# Patient Record
Sex: Female | Born: 1985 | Race: White | Hispanic: No | Smoking: Former smoker
Health system: Southern US, Community
[De-identification: ages and names within clinical notes are randomized; demographics above are authoritative.]

## PROBLEM LIST (undated history)

## (undated) DIAGNOSIS — K219 Gastro-esophageal reflux disease without esophagitis: Secondary | ICD-10-CM

## (undated) DIAGNOSIS — K8020 Calculus of gallbladder without cholecystitis without obstruction: Secondary | ICD-10-CM

## (undated) DIAGNOSIS — Z9049 Acquired absence of other specified parts of digestive tract: Secondary | ICD-10-CM

## (undated) DIAGNOSIS — M5430 Sciatica, unspecified side: Secondary | ICD-10-CM

## (undated) DIAGNOSIS — R51 Headache: Secondary | ICD-10-CM

## (undated) DIAGNOSIS — D649 Anemia, unspecified: Secondary | ICD-10-CM

## (undated) HISTORY — DX: Acquired absence of other specified parts of digestive tract: Z90.49

---
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
## 2007-08-22 HISTORY — PX: DILATION AND CURETTAGE OF UTERUS: SHX78

## 2009-07-13 ENCOUNTER — Encounter: Admission: RE | Admit: 2009-07-13 | Discharge: 2009-07-13 | Payer: Self-pay | Admitting: *Deleted

## 2009-11-27 ENCOUNTER — Emergency Department (HOSPITAL_COMMUNITY): Admission: EM | Admit: 2009-11-27 | Discharge: 2009-11-27 | Payer: Self-pay | Admitting: Emergency Medicine

## 2011-12-25 ENCOUNTER — Emergency Department (HOSPITAL_COMMUNITY): Payer: No Typology Code available for payment source

## 2011-12-25 ENCOUNTER — Emergency Department (HOSPITAL_COMMUNITY)
Admission: EM | Admit: 2011-12-25 | Discharge: 2011-12-25 | Disposition: A | Discharge status: Home / Self Care | Payer: No Typology Code available for payment source | Attending: Emergency Medicine | Admitting: Emergency Medicine

## 2011-12-25 ENCOUNTER — Encounter (HOSPITAL_COMMUNITY): Payer: Self-pay | Admitting: *Deleted

## 2011-12-25 DIAGNOSIS — F411 Generalized anxiety disorder: Secondary | ICD-10-CM | POA: Insufficient documentation

## 2011-12-25 DIAGNOSIS — R110 Nausea: Secondary | ICD-10-CM | POA: Insufficient documentation

## 2011-12-25 DIAGNOSIS — R51 Headache: Secondary | ICD-10-CM | POA: Insufficient documentation

## 2011-12-25 DIAGNOSIS — M542 Cervicalgia: Secondary | ICD-10-CM | POA: Insufficient documentation

## 2011-12-25 DIAGNOSIS — R0989 Other specified symptoms and signs involving the circulatory and respiratory systems: Secondary | ICD-10-CM | POA: Insufficient documentation

## 2011-12-25 DIAGNOSIS — R209 Unspecified disturbances of skin sensation: Secondary | ICD-10-CM | POA: Insufficient documentation

## 2011-12-25 DIAGNOSIS — R0609 Other forms of dyspnea: Secondary | ICD-10-CM | POA: Insufficient documentation

## 2011-12-25 DIAGNOSIS — H539 Unspecified visual disturbance: Secondary | ICD-10-CM | POA: Insufficient documentation

## 2011-12-25 DIAGNOSIS — S060X0A Concussion without loss of consciousness, initial encounter: Secondary | ICD-10-CM | POA: Insufficient documentation

## 2011-12-25 LAB — CBC
HCT: 39.2 % (ref 36.0–46.0)
MCH: 32.2 pg (ref 26.0–34.0)
MCHC: 35.2 g/dL (ref 30.0–36.0)
MCV: 91.4 fL (ref 78.0–100.0)
RDW: 12.4 % (ref 11.5–15.5)
WBC: 13 10*3/uL — ABNORMAL HIGH (ref 4.0–10.5)

## 2011-12-25 LAB — POCT I-STAT, CHEM 8
Creatinine, Ser: 0.5 mg/dL (ref 0.50–1.10)
Glucose, Bld: 125 mg/dL — ABNORMAL HIGH (ref 70–99)
Hemoglobin: 13.6 g/dL (ref 12.0–15.0)
Sodium: 140 mEq/L (ref 135–145)
TCO2: 21 mmol/L (ref 0–100)

## 2011-12-25 LAB — URINALYSIS, ROUTINE W REFLEX MICROSCOPIC
Bilirubin Urine: NEGATIVE
Nitrite: NEGATIVE
Protein, ur: NEGATIVE mg/dL
pH: 7.5 (ref 5.0–8.0)

## 2011-12-25 LAB — DIFFERENTIAL
Basophils Absolute: 0 10*3/uL (ref 0.0–0.1)
Eosinophils Absolute: 0 10*3/uL (ref 0.0–0.7)
Eosinophils Relative: 0 % (ref 0–5)
Lymphocytes Relative: 10 % — ABNORMAL LOW (ref 12–46)
Monocytes Absolute: 0.6 10*3/uL (ref 0.1–1.0)

## 2011-12-25 LAB — ETHANOL: Alcohol, Ethyl (B): <11 mg/dL (ref 0–11)

## 2011-12-25 LAB — RAPID URINE DRUG SCREEN, HOSP PERFORMED
Amphetamines: NOT DETECTED
Barbiturates: NOT DETECTED
Benzodiazepines: NOT DETECTED

## 2011-12-25 LAB — ACETAMINOPHEN LEVEL: Acetaminophen (Tylenol), Serum: <15 ug/mL (ref 10–30)

## 2011-12-25 MED ORDER — ONDANSETRON 8 MG PO TBDP
8.0000 mg | ORAL_TABLET | Freq: Once | ORAL | Status: AC
  Administered 2011-12-25: 8 mg via ORAL
  Filled 2011-12-25: qty 1

## 2011-12-25 MED ORDER — HYDROCODONE-ACETAMINOPHEN 5-325 MG PO TABS: 1.0000 | ORAL_TABLET | ORAL | Status: AC | PRN

## 2011-12-25 MED ORDER — ALPRAZOLAM 0.5 MG PO TABS
0.5000 mg | ORAL_TABLET | Freq: Once | ORAL | Status: AC
  Administered 2011-12-25: 0.5 mg via ORAL
  Filled 2011-12-25: qty 1

## 2011-12-25 MED ORDER — OXYCODONE-ACETAMINOPHEN 5-325 MG PO TABS
2.0000 | ORAL_TABLET | Freq: Once | ORAL | Status: AC
  Administered 2011-12-25: 2 via ORAL
  Filled 2011-12-25: qty 2

## 2011-12-25 MED ORDER — TETANUS-DIPHTH-ACELL PERTUSSIS 5-2.5-18.5 LF-MCG/0.5 IM SUSP
0.5000 mL | Freq: Once | INTRAMUSCULAR | Status: AC
  Administered 2011-12-25: 0.5 mL via INTRAMUSCULAR
  Filled 2011-12-25: qty 0.5

## 2011-12-25 MED ORDER — DIAZEPAM 5 MG/ML IJ SOLN
5.0000 mg | Freq: Once | INTRAMUSCULAR | Status: DC
  Filled 2011-12-25: qty 2

## 2011-12-25 MED ORDER — LORAZEPAM 1 MG PO TABS
1.0000 mg | ORAL_TABLET | Freq: Once | ORAL | Status: AC
  Administered 2011-12-25: 1 mg via ORAL
  Filled 2011-12-25: qty 1

## 2011-12-25 MED ORDER — HYDROMORPHONE HCL PF 1 MG/ML IJ SOLN
1.0000 mg | Freq: Once | INTRAMUSCULAR | Status: AC
  Administered 2011-12-25: 1 mg via INTRAMUSCULAR
  Filled 2011-12-25: qty 1
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
## 2011-12-25 NOTE — Discharge Instructions (Signed)
Concussion and Brain Injury A blow or jolt to the head can disrupt the normal function of the brain. This type of brain injury is often called a "concussion" or a "closed head injury." Concussions are usually not life-threatening. Even so, the effects of a concussion can be serious.  CAUSES  A concussion is caused by a blunt blow to the head. The blow might be direct or indirect as described below.  Direct blow (running into another player during a soccer game, being hit in a fight, or hitting your head on a hard surface).   Indirect blow (when your head moves rapidly and violently back and forth like in a car crash).  SYMPTOMS  The brain is very complex. Every head injury is different. Some symptoms may appear right away. Other symptoms may not show up for days or weeks after the concussion. The signs of concussion can be hard to notice. Early on, problems may be missed by patients, family members, and caregivers. You may look fine even though you are acting or feeling differently.  These symptoms are usually temporary, but may last for days, weeks, or even longer. Symptoms include:  Mild headaches that will not go away.   Having more trouble than usual with:   Remembering things.   Paying attention or concentrating.   Organizing daily tasks.   Making decisions and solving problems.   Slowness in thinking, acting, speaking, or reading.   Getting lost or easily confused.   Feeling tired all the time or lacking energy (fatigue).   Feeling drowsy.   Sleep disturbances.   Sleeping more than usual.   Sleeping less than usual.   Trouble falling asleep.   Trouble sleeping (insomnia).   Loss of balance or feeling lightheaded or dizzy.   Nausea or vomiting.   Numbness or tingling.   Increased sensitivity to:   Sounds.   Lights.   Distractions.  Other symptoms might include:  Vision problems or eyes that tire easily.   Diminished sense of taste or smell.   Ringing  in the ears.   Mood changes such as feeling sad, anxious, or listless.   Becoming easily irritated or angry for little or no reason.   Lack of motivation.  DIAGNOSIS  Your caregiver can usually diagnose a concussion or mild brain injury based on your description of your injury and your symptoms.  Your evaluation might include:  A brain scan to look for signs of injury to the brain. Even if the test shows no injury, you may still have a concussion.   Blood tests to be sure other problems are not present.  TREATMENT   People with a concussion need to be examined and evaluated. Most people with concussions are treated in an emergency department, urgent care, or clinic. Some people must stay in the hospital overnight for further treatment.   Your caregiver will send you home with important instructions to follow. Be sure to carefully follow them.   Tell your caregiver if you are already taking any medicines (prescription, over-the-counter, or natural remedies), or if you are drinking alcohol or taking illegal drugs. Also, talk with your caregiver if you are taking blood thinners (anticoagulants) or aspirin. These drugs may increase your chances of complications. All of this is important information that may affect treatment.   Only take over-the-counter or prescription medicines for pain, discomfort, or fever as directed by your caregiver.  PROGNOSIS  How fast people recover from brain injury varies from person to person.   Although most people have a good recovery, how quickly they improve depends on many factors. These factors include how severe their concussion was, what part of the brain was injured, their age, and how healthy they were before the concussion.  Because all head injuries are different, so is recovery. Most people with mild injuries recover fully. Recovery can take time. In general, recovery is slower in older persons. Also, persons who have had a concussion in the past or have  other medical problems may find that it takes longer to recover from their current injury. Anxiety and depression may also make it harder to adjust to the symptoms of brain injury. HOME CARE INSTRUCTIONS  Return to your normal activities slowly, not all at once. You must give your body and brain enough time for recovery.  Get plenty of sleep at night, and rest during the day. Rest helps the brain to heal.   Avoid staying up late at night.   Keep the same bedtime hours on weekends and weekdays.   Take daytime naps or rest breaks when you feel tired.   Limit activities that require a lot of thought or concentration (brain or cognitive rest). This includes:   Homework or job-related work.   Watching TV.   Computer work.   Avoid activities that could lead to a second brain injury, such as contact or recreational sports, until your caregiver says it is okay. Even after your brain injury has healed, you should protect yourself from having another concussion.   Ask your caregiver when you can return to your normal activities such as driving, bicycling, or operating heavy equipment. Your ability to react may be slower after a brain injury.   Talk with your caregiver about when you can return to work or school.   Inform your teachers, school nurse, school counselor, coach, athletic trainer, or work manager about your injury, symptoms, and restrictions. They should be instructed to report:   Increased problems with attention or concentration.   Increased problems remembering or learning new information.   Increased time needed to complete tasks or assignments.   Increased irritability or decreased ability to cope with stress.   Increased symptoms.   Take only those medicines that your caregiver has approved.   Do not drink alcohol until your caregiver says you are well enough to do so. Alcohol and certain other drugs may slow your recovery and can put you at risk of further injury.    If it is harder than usual to remember things, write them down.   If you are easily distracted, try to do one thing at a time. For example, do not try to watch TV while fixing dinner.   Talk with family members or close friends when making important decisions.   Keep all follow-up appointments. Repeated evaluation of your symptoms is recommended for your recovery.  PREVENTION  Protect your head from future injury. It is very important to avoid another head or brain injury before you have recovered. In rare cases, another injury has lead to permanent brain damage, brain swelling, or death. Avoid injuries by using:  Seatbelts when riding in a car.   Alcohol only in moderation.   A helmet when biking, skiing, skateboarding, skating, or doing similar activities.   Safety measures in your home.   Remove clutter and tripping hazards from floors and stairways.   Use grab bars in bathrooms and handrails by stairs.   Place non-slip mats on floors and in bathtubs.     Improve lighting in dim areas.  SEEK MEDICAL CARE IF:  A head injury can cause lingering symptoms. You should seek medical care if you have any of the following symptoms for more than 3 weeks after your injury or are planning to return to sports:  Chronic headaches.   Dizziness or balance problems.   Nausea.   Vision problems.   Increased sensitivity to noise or light.   Depression or mood swings.   Anxiety or irritability.   Memory problems.   Difficulty concentrating or paying attention.   Sleep problems.   Feeling tired all the time.  SEEK IMMEDIATE MEDICAL CARE IF:  You have had a blow or jolt to the head and you (or your family or friends) notice:  Severe or worsening headaches.   Weakness (even if only in one hand or one leg or one part of the face), numbness, or decreased coordination.   Repeated vomiting.   Increased sleepiness or passing out.   One black center of the eye (pupil) is larger  than the other.   Convulsions (seizures).   Slurred speech.   Increasing confusion, restlessness, agitation, or irritability.   Lack of ability to recognize people or places.   Neck pain.   Difficulty being awakened.   Unusual behavior changes.   Loss of consciousness.  Older adults with a brain injury may have a higher risk of serious complications such as a blood clot on the brain. Headaches that get worse or an increase in confusion are signs of this complication. If these signs occur, see a caregiver right away. MAKE SURE YOU:   Understand these instructions.   Will watch your condition.   Will get help right away if you are not doing well or get worse.  FOR MORE INFORMATION  Several groups help people with brain injury and their families. They provide information and put people in touch with local resources. These include support groups, rehabilitation services, and a variety of health care professionals. Among these groups, the Brain Injury Association (BIA, www.biausa.org) has a national office that gathers scientific and educational information and works on a national level to help people with brain injury.  Document Released: 10/28/2003 Document Revised: 07/27/2011 Document Reviewed: 03/25/2008 Exit[employer] Patient Information 2012 [employer]. 

## 2011-12-25 NOTE — ED Notes (Signed)
Pt vitals BP 128/72, pulse 89, resp 14.

## 2012-10-28 ENCOUNTER — Emergency Department (HOSPITAL_COMMUNITY)
Admission: EM | Admit: 2012-10-28 | Discharge: 2012-10-28 | Disposition: A | Discharge status: Home / Self Care | Payer: BC Managed Care – PPO | Attending: Emergency Medicine | Admitting: Emergency Medicine

## 2012-10-28 ENCOUNTER — Encounter (HOSPITAL_COMMUNITY): Payer: Self-pay | Admitting: *Deleted

## 2012-10-28 DIAGNOSIS — Z8659 Personal history of other mental and behavioral disorders: Secondary | ICD-10-CM | POA: Insufficient documentation

## 2012-10-28 DIAGNOSIS — Z791 Long term (current) use of non-steroidal anti-inflammatories (NSAID): Secondary | ICD-10-CM | POA: Insufficient documentation

## 2012-10-28 DIAGNOSIS — F17200 Nicotine dependence, unspecified, uncomplicated: Secondary | ICD-10-CM | POA: Insufficient documentation

## 2012-10-28 DIAGNOSIS — Z3202 Encounter for pregnancy test, result negative: Secondary | ICD-10-CM | POA: Insufficient documentation

## 2012-10-28 DIAGNOSIS — F3289 Other specified depressive episodes: Secondary | ICD-10-CM | POA: Insufficient documentation

## 2012-10-28 DIAGNOSIS — F329 Major depressive disorder, single episode, unspecified: Secondary | ICD-10-CM | POA: Insufficient documentation

## 2012-10-28 LAB — ETHANOL: Alcohol, Ethyl (B): 22 mg/dL — ABNORMAL HIGH (ref 0–11)

## 2012-10-28 LAB — ACETAMINOPHEN LEVEL: Acetaminophen (Tylenol), Serum: <15 ug/mL (ref 10–30)

## 2012-10-28 LAB — COMPREHENSIVE METABOLIC PANEL
AST: 15 U/L (ref 0–37)
Albumin: 4.4 g/dL (ref 3.5–5.2)
Alkaline Phosphatase: 65 U/L (ref 39–117)
BUN: 6 mg/dL (ref 6–23)
Chloride: 103 mEq/L (ref 96–112)
Potassium: 4 mEq/L (ref 3.5–5.1)
Total Bilirubin: 0.6 mg/dL (ref 0.3–1.2)

## 2012-10-28 LAB — RAPID URINE DRUG SCREEN, HOSP PERFORMED
Amphetamines: NOT DETECTED
Barbiturates: NOT DETECTED
Benzodiazepines: NOT DETECTED

## 2012-10-28 LAB — POCT PREGNANCY, URINE: Preg Test, Ur: NEGATIVE

## 2012-10-28 LAB — CBC
HCT: 39.2 % (ref 36.0–46.0)
RBC: 4.22 MIL/uL (ref 3.87–5.11)
RDW: 12 % (ref 11.5–15.5)
WBC: 11.2 10*3/uL — ABNORMAL HIGH (ref 4.0–10.5)

## 2012-10-28 MED ORDER — LORAZEPAM 1 MG PO TABS: 1.0000 mg | ORAL_TABLET | Freq: Three times a day (TID) | ORAL | Status: DC | PRN

## 2012-10-28 NOTE — ED Provider Notes (Signed)
History     CSN: [id number]  Arrival date & time 10/28/12  1403   First MD Initiated Contact with Patient 10/28/12 1439      Chief Complaint  Patient presents with  . Medical Clearance    (Consider location/radiation/quality/duration/timing/severity/associated sxs/prior treatment) HPI Comments: Patient comes to the ER for evaluation of depression. Patient reports that she has been depressed for "years" but recently symptoms have been worsening. She says she has had fleeting thoughts of harming herself, but currently no plan to harm herself. She has not had any homicidal ideation. There are no hallucinations. Patient has never really been treated for depression in the past. She is accompanied by family report that your symptoms have progressively worsened over a long period of time.   Past Medical History  Diagnosis Date  . Anxiety attack     Past Surgical History  Procedure Laterality Date  . Dilation and curettage of uterus      History reviewed. No pertinent family history.  History  Substance Use Topics  . Smoking status: Current Every Day Smoker -- 0.50 packs/day    Types: Cigarettes  . Smokeless tobacco: Never Used  . Alcohol Use: Yes     Comment: twice a week    OB History   Grav Para Term Preterm Abortions TAB SAB Ect Mult Living                  Review of Systems  Psychiatric/Behavioral: Positive for dysphoric mood.  All other systems reviewed and are negative.    Allergies  Review of patient's allergies indicates no known allergies.  Home Medications   Current Outpatient Rx  Name  Route  Sig  Dispense  Refill  . naproxen sodium (ANAPROX) 220 MG tablet   Oral   Take 220 mg by mouth 2 (two) times daily with a meal.           BP 131/74  Pulse 87  Temp(Src) 98.6 F (37 C) (Oral)  Resp 20  Wt 195 lb (88.451 kg)  SpO2 98%  LMP 10/28/2012  Physical Exam  Constitutional: She is oriented to person, place, and time. She appears well-developed  and well-nourished. No distress.  HENT:  Head: Normocephalic and atraumatic.  Right Ear: Hearing normal.  Nose: Nose normal.  Mouth/Throat: Oropharynx is clear and moist and mucous membranes are normal.  Eyes: Conjunctivae and EOM are normal. Pupils are equal, round, and reactive to light.  Neck: Normal range of motion. Neck supple.  Cardiovascular: Normal rate, regular rhythm, S1 normal and S2 normal.  Exam reveals no gallop and no friction rub.   No murmur heard. Pulmonary/Chest: Effort normal and breath sounds normal. No respiratory distress. She exhibits no tenderness.  Abdominal: Soft. Normal appearance and bowel sounds are normal. There is no hepatosplenomegaly. There is no tenderness. There is no rebound, no guarding, no tenderness at McBurney's point and negative Murphy's sign. No hernia.  Musculoskeletal: Normal range of motion.  Neurological: She is alert and oriented to person, place, and time. She has normal strength. No cranial nerve deficit or sensory deficit. Coordination normal. GCS eye subscore is 4. GCS verbal subscore is 5. GCS motor subscore is 6.  Skin: Skin is warm, dry and intact. No rash noted. No cyanosis.  Psychiatric: Her speech is normal and behavior is normal. Thought content normal. She exhibits a depressed mood.    ED Course  Procedures (including critical care time)  Labs Reviewed  CBC - Abnormal; Notable for the  following:    WBC 11.2 (*)    All other components within normal limits  ETHANOL - Abnormal; Notable for the following:    Alcohol, Ethyl (B) 22 (*)    All other components within normal limits  SALICYLATE LEVEL - Abnormal; Notable for the following:    Salicylate Lvl <2.0 (*)    All other components within normal limits  URINE RAPID DRUG SCREEN (HOSP PERFORMED) - Abnormal; Notable for the following:    Tetrahydrocannabinol POSITIVE (*)    All other components within normal limits  ACETAMINOPHEN LEVEL  COMPREHENSIVE METABOLIC PANEL  POCT  PREGNANCY, URINE   No results found.   Diagnosis: Depression    MDM  Patient presents to the ER with complaints of progressively worsening depression. She has had intermittent thoughts of harming herself, but does not have any current plan. She has never been treated for her depression. She will require an evaluation. Patient is medically clear for evaluation.        Gilda Crease, MD 10/28/12 [phone]

## 2012-10-28 NOTE — ED Notes (Signed)
Security called to wand pt and pt's personal belongings.

## 2012-10-28 NOTE — ED Notes (Signed)
Pt from home with reports of intermittent SI with no plan "for years" but thoughts became worse since yesterday. Pt denies HI and AV hallucinations.

## 2012-10-28 NOTE — ED Notes (Signed)
Mother visiting /wpatient
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
## 2012-10-28 NOTE — ED Notes (Signed)
BF visiting w/patient

## 2013-07-29 ENCOUNTER — Emergency Department (HOSPITAL_COMMUNITY): Payer: Self-pay

## 2013-07-29 ENCOUNTER — Encounter (HOSPITAL_COMMUNITY): Payer: Self-pay | Admitting: Emergency Medicine

## 2013-07-29 ENCOUNTER — Emergency Department (HOSPITAL_COMMUNITY)
Admission: EM | Admit: 2013-07-29 | Discharge: 2013-07-29 | Disposition: A | Discharge status: Home / Self Care | Payer: Self-pay | Attending: Emergency Medicine | Admitting: Emergency Medicine

## 2013-07-29 DIAGNOSIS — Z3202 Encounter for pregnancy test, result negative: Secondary | ICD-10-CM | POA: Insufficient documentation

## 2013-07-29 DIAGNOSIS — Z791 Long term (current) use of non-steroidal anti-inflammatories (NSAID): Secondary | ICD-10-CM | POA: Insufficient documentation

## 2013-07-29 DIAGNOSIS — F17200 Nicotine dependence, unspecified, uncomplicated: Secondary | ICD-10-CM | POA: Insufficient documentation

## 2013-07-29 DIAGNOSIS — R112 Nausea with vomiting, unspecified: Secondary | ICD-10-CM | POA: Insufficient documentation

## 2013-07-29 DIAGNOSIS — E669 Obesity, unspecified: Secondary | ICD-10-CM | POA: Insufficient documentation

## 2013-07-29 DIAGNOSIS — Z8659 Personal history of other mental and behavioral disorders: Secondary | ICD-10-CM | POA: Insufficient documentation

## 2013-07-29 DIAGNOSIS — K8020 Calculus of gallbladder without cholecystitis without obstruction: Secondary | ICD-10-CM | POA: Insufficient documentation

## 2013-07-29 LAB — COMPREHENSIVE METABOLIC PANEL
AST: 14 U/L (ref 0–37)
Albumin: 3.6 g/dL (ref 3.5–5.2)
Alkaline Phosphatase: 65 U/L (ref 39–117)
Chloride: 103 mEq/L (ref 96–112)
Creatinine, Ser: 0.6 mg/dL (ref 0.50–1.10)
Potassium: 3.6 mEq/L (ref 3.5–5.1)
Total Bilirubin: 0.2 mg/dL — ABNORMAL LOW (ref 0.3–1.2)

## 2013-07-29 LAB — URINALYSIS, ROUTINE W REFLEX MICROSCOPIC
Glucose, UA: NEGATIVE mg/dL
Ketones, ur: NEGATIVE mg/dL
Leukocytes, UA: NEGATIVE
Specific Gravity, Urine: 1.021 (ref 1.005–1.030)
pH: 6.5 (ref 5.0–8.0)

## 2013-07-29 LAB — CBC WITH DIFFERENTIAL/PLATELET
Basophils Absolute: 0 10*3/uL (ref 0.0–0.1)
Basophils Relative: 1 % (ref 0–1)
MCHC: 35 g/dL (ref 30.0–36.0)
Neutro Abs: 5.4 10*3/uL (ref 1.7–7.7)
Neutrophils Relative %: 65 % (ref 43–77)
RDW: 11.9 % (ref 11.5–15.5)

## 2013-07-29 MED ORDER — OXYCODONE-ACETAMINOPHEN 5-325 MG PO TABS: 1.0000 | ORAL_TABLET | ORAL | Status: DC | PRN

## 2013-07-29 MED ORDER — ONDANSETRON HCL 4 MG/2ML IJ SOLN
4.0000 mg | Freq: Once | INTRAMUSCULAR | Status: AC
  Administered 2013-07-29: 4 mg via INTRAVENOUS
  Filled 2013-07-29: qty 2

## 2013-07-29 MED ORDER — ONDANSETRON 8 MG PO TBDP: 8.0000 mg | ORAL_TABLET | Freq: Three times a day (TID) | ORAL | Status: DC | PRN

## 2013-07-29 MED ORDER — HYDROMORPHONE HCL PF 1 MG/ML IJ SOLN
1.0000 mg | Freq: Once | INTRAMUSCULAR | Status: AC
  Administered 2013-07-29: 1 mg via INTRAVENOUS
  Filled 2013-07-29: qty 1

## 2013-07-29 NOTE — ED Provider Notes (Signed)
CSN: [id number]     Arrival date & time 07/29/13  4098 History   First MD Initiated Contact with Patient 07/29/13 [phone]     Chief Complaint  Patient presents with  . Abdominal Pain    HPI Patient reports developing acute right upper quadrant abdominal pain that started around 4:30 this morning.  She's had nausea and vomiting.  She denies diarrhea.  She's never been told she had gallstones before.  No recent rapid weight loss.  No urinary symptoms.  No hematemesis.  Pain is moderate to severe in severity this time.  Pain waxes and wanes. Past Medical History  Diagnosis Date  . Anxiety attack    Past Surgical History  Procedure Laterality Date  . Dilation and curettage of uterus     History reviewed. No pertinent family history. History  Substance Use Topics  . Smoking status: Current Every Day Smoker -- 0.50 packs/day    Types: Cigarettes  . Smokeless tobacco: Never Used  . Alcohol Use: Yes     Comment: twice a week   OB History   Grav Para Term Preterm Abortions TAB SAB Ect Mult Living                 Review of Systems  All other systems reviewed and are negative.    Allergies  Review of patient's allergies indicates no known allergies.  Home Medications   Current Outpatient Rx  Name  Route  Sig  Dispense  Refill  . naproxen sodium (ANAPROX) 220 MG tablet   Oral   Take 220 mg by mouth 2 (two) times daily with a meal.         . ondansetron (ZOFRAN ODT) 8 MG disintegrating tablet   Oral   Take 1 tablet (8 mg total) by mouth every 8 (eight) hours as needed for nausea or vomiting.   10 tablet   0   . oxyCODONE-acetaminophen (PERCOCET/ROXICET) 5-325 MG per tablet   Oral   Take 1 tablet by mouth every 4 (four) hours as needed for severe pain.   20 tablet   0    BP 144/88  Pulse 83  Temp(Src) 98 F (36.7 C) (Oral)  Resp 18  Ht 5\' 6"  (1.676 m)  Wt 240 lb (108.863 kg)  BMI 38.76 kg/m2  SpO2 99%  LMP 07/25/2013 Physical Exam  Nursing note and vitals  reviewed. Constitutional: She is oriented to person, place, and time. She appears well-developed and well-nourished.  Uncomfortable appearing  HENT:  Head: Normocephalic and atraumatic.  Eyes: EOM are normal.  Neck: Normal range of motion.  Cardiovascular: Normal rate, regular rhythm and normal heart sounds.   Pulmonary/Chest: Effort normal and breath sounds normal.  Abdominal: Soft. She exhibits no distension. There is no tenderness.  Obese  Musculoskeletal: Normal range of motion.  Neurological: She is alert and oriented to person, place, and time.  Skin: Skin is warm and dry.  Psychiatric: She has a normal mood and affect. Judgment normal.    ED Course  Procedures (including critical care time) Labs Review Labs Reviewed  COMPREHENSIVE METABOLIC PANEL - Abnormal; Notable for the following:    Glucose, Bld 116 (*)    Total Bilirubin 0.2 (*)    All other components within normal limits  CBC WITH DIFFERENTIAL  LIPASE, BLOOD  URINALYSIS, ROUTINE W REFLEX MICROSCOPIC  POCT PREGNANCY, URINE   Imaging Review US Abdomen Complete  07/29/2013   CLINICAL DATA:  Abdominal pain.  EXAM: ULTRASOUND ABDOMEN COMPLETE  COMPARISON:  None.  FINDINGS: Gallbladder:  There is an approximately 2.3 cm gallstone within the gallbladder neck. This could not be dislodged by changing the patient's position. There is no gallbladder wall thickening or pericholecystic fluid. Sonographic Eulah Pont sign is absent.  Common bile duct:  Diameter: 5.0 mm.  No evidence of intraductal calculus.  Liver:  No focal lesion identified. Within normal limits in parenchymal echogenicity.  IVC:  No abnormality visualized.  Pancreas:  Visualized portion unremarkable.  Spleen:  Size and appearance within normal limits.  Right Kidney:  Length: 11.5 cm. Echogenicity within normal limits. No mass or hydronephrosis visualized.  Left Kidney:  Length: 11.8 cm. Echogenicity within normal limits. No mass or hydronephrosis visualized.  Abdominal  aorta:  No aneurysm visualized.  Other findings:  None.  IMPRESSION: 1. Large gallstone within the gallbladder neck, potentially impacted. No specific signs of cholecystitis identified. 2. No biliary dilatation. 3. Otherwise unremarkable examination.   Electronically Signed   By: Roxy Horseman M.D.   On: 07/29/2013 09:36  I personally reviewed the imaging tests through PACS system I reviewed available ER/hospitalization records through the EMR a  EKG Interpretation   None       MDM   1. Cholelithiasis    9:54 AM Thank and shoulder this time.  Patient feeling much better.  Symptomatic cholelithiasis.  Patient be discharged home with outpatient general surgery followup.  Home with appropriate medications.  She understands return to the ER for new or worsening symptoms.  Standard return precautions were given including worsening abdominal pain, fever chills, persistent nausea vomiting    Lyanne Co, MD 07/29/13 [phone]

## 2013-07-29 NOTE — ED Notes (Signed)
Ultrasound at bedside

## 2013-07-29 NOTE — ED Notes (Signed)
Pt states she was awoken by sharp RUQ abdominal pain at 0430, reports n/v x2, denies diarrhea.

## 2013-09-07 ENCOUNTER — Emergency Department (HOSPITAL_COMMUNITY)
Admission: EM | Admit: 2013-09-07 | Discharge: 2013-09-07 | Disposition: A | Discharge status: Home / Self Care | Payer: BC Managed Care – PPO | Attending: Emergency Medicine | Admitting: Emergency Medicine

## 2013-09-07 ENCOUNTER — Encounter (HOSPITAL_COMMUNITY): Payer: Self-pay | Admitting: Emergency Medicine

## 2013-09-07 DIAGNOSIS — K8020 Calculus of gallbladder without cholecystitis without obstruction: Secondary | ICD-10-CM | POA: Insufficient documentation

## 2013-09-07 DIAGNOSIS — F17200 Nicotine dependence, unspecified, uncomplicated: Secondary | ICD-10-CM | POA: Insufficient documentation

## 2013-09-07 DIAGNOSIS — Z3202 Encounter for pregnancy test, result negative: Secondary | ICD-10-CM | POA: Insufficient documentation

## 2013-09-07 DIAGNOSIS — Z791 Long term (current) use of non-steroidal anti-inflammatories (NSAID): Secondary | ICD-10-CM | POA: Insufficient documentation

## 2013-09-07 DIAGNOSIS — Z8659 Personal history of other mental and behavioral disorders: Secondary | ICD-10-CM | POA: Insufficient documentation

## 2013-09-07 LAB — URINALYSIS, ROUTINE W REFLEX MICROSCOPIC
BILIRUBIN URINE: NEGATIVE
GLUCOSE, UA: NEGATIVE mg/dL
KETONES UR: NEGATIVE mg/dL
Leukocytes, UA: NEGATIVE
Nitrite: NEGATIVE
PH: 6.5 (ref 5.0–8.0)
PROTEIN: NEGATIVE mg/dL
Specific Gravity, Urine: 1.028 (ref 1.005–1.030)
Urobilinogen, UA: 0.2 mg/dL (ref 0.0–1.0)

## 2013-09-07 LAB — CBC WITH DIFFERENTIAL/PLATELET
Basophils Absolute: 0.1 10*3/uL (ref 0.0–0.1)
Basophils Relative: 0 % (ref 0–1)
EOS ABS: 0.3 10*3/uL (ref 0.0–0.7)
EOS PCT: 3 % (ref 0–5)
HCT: 39.2 % (ref 36.0–46.0)
HEMOGLOBIN: 13.6 g/dL (ref 12.0–15.0)
LYMPHS ABS: 3.6 10*3/uL (ref 0.7–4.0)
Lymphocytes Relative: 31 % (ref 12–46)
MCH: 32.5 pg (ref 26.0–34.0)
MCHC: 34.7 g/dL (ref 30.0–36.0)
MCV: 93.6 fL (ref 78.0–100.0)
MONOS PCT: 5 % (ref 3–12)
Monocytes Absolute: 0.6 10*3/uL (ref 0.1–1.0)
Neutro Abs: 6.9 10*3/uL (ref 1.7–7.7)
Neutrophils Relative %: 61 % (ref 43–77)
Platelets: 219 10*3/uL (ref 150–400)
RBC: 4.19 MIL/uL (ref 3.87–5.11)
RDW: 12.1 % (ref 11.5–15.5)
WBC: 11.5 10*3/uL — ABNORMAL HIGH (ref 4.0–10.5)

## 2013-09-07 LAB — COMPREHENSIVE METABOLIC PANEL
ALK PHOS: 71 U/L (ref 39–117)
ALT: 35 U/L (ref 0–35)
AST: 21 U/L (ref 0–37)
Albumin: 4 g/dL (ref 3.5–5.2)
BUN: 18 mg/dL (ref 6–23)
CALCIUM: 9.1 mg/dL (ref 8.4–10.5)
CO2: 25 mEq/L (ref 19–32)
Chloride: 97 mEq/L (ref 96–112)
Creatinine, Ser: 0.91 mg/dL (ref 0.50–1.10)
GFR calc Af Amer: >90 mL/min (ref >90)
GFR calc non Af Amer: 85 mL/min — ABNORMAL LOW (ref >90)
GLUCOSE: 95 mg/dL (ref 70–99)
POTASSIUM: 4.1 meq/L (ref 3.7–5.3)
Sodium: 136 mEq/L — ABNORMAL LOW (ref 137–147)
TOTAL PROTEIN: 7.2 g/dL (ref 6.0–8.3)
Total Bilirubin: 0.5 mg/dL (ref 0.3–1.2)

## 2013-09-07 LAB — URINE MICROSCOPIC-ADD ON

## 2013-09-07 LAB — LIPASE, BLOOD: LIPASE: 24 U/L (ref 11–59)

## 2013-09-07 LAB — PREGNANCY, URINE: Preg Test, Ur: NEGATIVE

## 2013-09-07 MED ORDER — ONDANSETRON HCL 4 MG/2ML IJ SOLN
4.0000 mg | Freq: Once | INTRAMUSCULAR | Status: AC
  Administered 2013-09-07: 4 mg via INTRAVENOUS
  Filled 2013-09-07: qty 2

## 2013-09-07 MED ORDER — OXYCODONE-ACETAMINOPHEN 5-325 MG PO TABS
1.0000 | ORAL_TABLET | Freq: Once | ORAL | Status: AC
  Administered 2013-09-07: 1 via ORAL
  Filled 2013-09-07: qty 1

## 2013-09-07 MED ORDER — SODIUM CHLORIDE 0.9 % IV SOLN
Freq: Once | INTRAVENOUS | Status: AC
  Administered 2013-09-07: 01:00:00 via INTRAVENOUS

## 2013-09-07 MED ORDER — HYDROMORPHONE HCL PF 1 MG/ML IJ SOLN
0.5000 mg | Freq: Once | INTRAMUSCULAR | Status: AC
  Administered 2013-09-07: 0.5 mg via INTRAVENOUS
  Filled 2013-09-07: qty 1

## 2013-09-07 MED ORDER — HYDROMORPHONE HCL PF 1 MG/ML IJ SOLN
0.5000 mg | Freq: Once | INTRAMUSCULAR | Status: AC
Start: 2013-09-07 — End: 2013-09-07
  Administered 2013-09-07: 0.5 mg via INTRAVENOUS
  Filled 2013-09-07: qty 1

## 2013-09-07 MED ORDER — OXYCODONE-ACETAMINOPHEN 5-325 MG PO TABS: 1.0000 | ORAL_TABLET | ORAL | Status: DC | PRN

## 2013-09-07 NOTE — ED Provider Notes (Signed)
CSN: [id number]     Arrival date & time 09/07/13  0000 History   First MD Initiated Contact with Patient 09/07/13 0021     Chief Complaint  Patient presents with  . Abdominal Pain  . Nausea   (Consider location/radiation/quality/duration/timing/severity/associated sxs/prior Treatment) Patient is a 28 y.o. female presenting with abdominal pain. The history is provided by the patient. No language interpreter was used.  Abdominal Pain Pain location:  RUQ Pain quality: sharp   Pain radiates to:  R flank Pain severity:  Moderate Onset quality:  Gradual Associated symptoms: nausea   Associated symptoms: no chills, no dysuria, no fever and no vomiting   Associated symptoms comment:  Diagnosed with gall stones one month ago by ultrasound in the emergency department and referred to surgery. She reports she has been doing well until today when she had recurrent RUQ pain with nausea. No vomiting or fever.    Past Medical History  Diagnosis Date  . Anxiety attack    Past Surgical History  Procedure Laterality Date  . Dilation and curettage of uterus     No family history on file. History  Substance Use Topics  . Smoking status: Current Every Day Smoker -- 0.50 packs/day    Types: Cigarettes  . Smokeless tobacco: Never Used  . Alcohol Use: Yes     Comment: twice a week   OB History   Grav Para Term Preterm Abortions TAB SAB Ect Mult Living                 Review of Systems  Constitutional: Negative for fever and chills.  HENT: Negative.   Respiratory: Negative.   Cardiovascular: Negative.   Gastrointestinal: Positive for nausea and abdominal pain. Negative for vomiting.  Genitourinary: Negative.  Negative for dysuria.  Musculoskeletal: Negative.   Skin: Negative.   Neurological: Negative.     Allergies  Review of patient's allergies indicates no known allergies.  Home Medications   Current Outpatient Rx  Name  Route  Sig  Dispense  Refill  . naproxen sodium (ANAPROX)  220 MG tablet   Oral   Take 220 mg by mouth 2 (two) times daily with a meal.         . ondansetron (ZOFRAN ODT) 8 MG disintegrating tablet   Oral   Take 1 tablet (8 mg total) by mouth every 8 (eight) hours as needed for nausea or vomiting.   10 tablet   0   . oxyCODONE-acetaminophen (PERCOCET/ROXICET) 5-325 MG per tablet   Oral   Take 1 tablet by mouth every 4 (four) hours as needed for severe pain.   20 tablet   0    LMP 08/27/2013 Physical Exam  Constitutional: She is oriented to person, place, and time. She appears well-developed and well-nourished.  HENT:  Head: Normocephalic.  Neck: Normal range of motion. Neck supple.  Pulmonary/Chest: Effort normal.  Abdominal: Soft. Bowel sounds are normal. There is tenderness. There is no rebound and no guarding.  RUQ pain to palpation. Soft abdomen. BS hypoactive.   Musculoskeletal: Normal range of motion.  Neurological: She is alert and oriented to person, place, and time.  Skin: Skin is warm and dry. No rash noted.  Psychiatric: She has a normal mood and affect.    ED Course  Procedures (including critical care time)  EMERGENCY DEPARTMENT BILIARY ULTRASOUND INTERPRETATION "Study: Limited Abdominal Ultrasound of the gallbladder and common bile duct."  INDICATIONS: Abdominal pain, RUQ pain and Nausea Indication: Multiple views of the  gallbladder and common bile duct were obtained in real-time with a Multi-frequency probe." PERFORMED BY:  Myself IMAGES ARCHIVED?: Yes FINDINGS: Gallstones present, Gallbladder wall normal in thickness, Sonographic Murphy's sign absent and Common bile duct normal in size LIMITATIONS: Body Habitus and Abdominal pain INTERPRETATION: Cholelithiasis   Labs Review Labs Reviewed  URINALYSIS, ROUTINE W REFLEX MICROSCOPIC - Abnormal; Notable for the following:    Hgb urine dipstick SMALL (*)    All other components within normal limits  CBC WITH DIFFERENTIAL - Abnormal; Notable for the following:     WBC 11.5 (*)    All other components within normal limits  COMPREHENSIVE METABOLIC PANEL - Abnormal; Notable for the following:    Sodium 136 (*)    GFR calc non Af Amer 85 (*)    All other components within normal limits  URINE MICROSCOPIC-ADD ON - Abnormal; Notable for the following:    Squamous Epithelial / LPF FEW (*)    All other components within normal limits  PREGNANCY, URINE  LIPASE, BLOOD   Imaging Review No results found.  EKG Interpretation   None       MDM  No diagnosis found. 1. Abdominal pain 2. History of cholelithiasis  Patient care transferred to [facility], PA-C, labs pending.     Arnoldo Hooker, PA-C 09/07/13 1610  Medical screening examination/treatment/procedure(s) were conducted as a shared visit with non-physician practitioner(s) or resident  and myself.  I personally evaluated the patient during the encounter and agree with the findings and plan unless otherwise indicated.    I have personally reviewed any xrays and/ or EKG's with the provider and I agree with interpretation.   Hx of cholelithiasis. Worsening RUQ pain tonight, radiation to back, mild nausea, severe on arrival.   Exam RUQ pain, no guarding, obese, mmm, non toxic appearing.  Plan for labs and pain meds. Bedside US done to look for signs of cholecystitis, none seen, gallstones visualized. Pt has insurance now, discussed seeing surgeon early this week and strict reasons to return. Pt improved with IV pain meds, po ordered.   Biliary Colic, Cholelithiasis  Enid Skeens, MD 09/07/13 [phone]

## 2013-09-07 NOTE — Discharge Instructions (Signed)
Take percocet as needed for severe pain.  Do not drive within four hours of taking this medication (may cause drowsiness or confusion).   Avoid fatty, greasy foods.  Follow up with general surgery at your earliest convenience.  You should return to the ER if you develop fever, worsening pain or uncontrolled vomiting.     Cholelithiasis Cholelithiasis (also called gallstones) is a form of gallbladder disease in which gallstones form in your gallbladder. The gallbladder is an organ that stores bile made in the liver, which helps digest fats. Gallstones begin as small crystals and slowly grow into stones. Gallstone pain occurs when the gallbladder spasms and a gallstone is blocking the duct. Pain can also occur when a stone passes out of the duct.  RISK FACTORS  Being female.   Having multiple pregnancies. Health care providers sometimes advise removing diseased gallbladders before future pregnancies.   Being obese.  Eating a diet heavy in fried foods and fat.   Being older than 60 years and increasing age.   Prolonged use of medicines containing female hormones.   Having diabetes mellitus.   Rapidly losing weight.   Having a family history of gallstones (heredity).  SYMPTOMS  Nausea.   Vomiting.  Abdominal pain.   Yellowing of the skin (jaundice).   Sudden pain. It may persist from several minutes to several hours.  Fever.   Tenderness to the touch. In some cases, when gallstones do not move into the bile duct, people have no pain or symptoms. These are called "silent" gallstones.  TREATMENT Silent gallstones do not need treatment. In severe cases, emergency surgery may be required. Options for treatment include:  Surgery to remove the gallbladder. This is the most common treatment.  Medicines. These do not always work and may take 6 12 months or more to work.  Shock wave treatment (extracorporeal biliary lithotripsy). In this treatment an ultrasound machine  sends shock waves to the gallbladder to break gallstones into smaller pieces that can pass into the intestines or be dissolved by medicine. HOME CARE INSTRUCTIONS   Only take over-the-counter or prescription medicines for pain, discomfort, or fever as directed by your health care provider.   Follow a low-fat diet until seen again by your health care provider. Fat causes the gallbladder to contract, which can result in pain.   Follow up with your health care provider as directed. Attacks are almost always recurrent and surgery is usually required for permanent treatment.  SEEK IMMEDIATE MEDICAL CARE IF:   Your pain increases and is not controlled by medicines.   You have a fever or persistent symptoms for more than 2 3 days.   You have a fever and your symptoms suddenly get worse.   You have persistent nausea and vomiting.  MAKE SURE YOU:   Understand these instructions.  Will watch your condition.  Will get help right away if you are not doing well or get worse. Document Released: 08/03/2005 Document Revised: 04/09/2013 Document Reviewed: 01/29/2013 [facility] Patient Information 2014 [place], [place].

## 2013-09-07 NOTE — ED Notes (Signed)
Pt has been diagnosed by ultrasound with gallstones,  States she is having abdominal and back pain,  8/10 with nausea,  Burst pain 9/10

## 2013-09-07 NOTE — ED Provider Notes (Signed)
Pt received from [place], PA-C.  Pt has known cholelithiasis.  Ran out of analgesics a few days ago.  Has not scheduled f/u w/ [facility] yet d/t health insurance status, but now has coverage.  Presented to ED w/ recurrent RUQ pain and nausea.  Labs unremarkable.  Dr. Jodi MourningZavitz has performed bedside US and no evidence of acute cholecystitis.  Pt reports that her pain level is 5/10 despite receiving IV dilaudid and she has persistent nausea as well.  A second dose of dilaudid as well as longer-acting percocet ordered for pain and zofran for nausea.  Will reassess and po challenge shortly. 1:15 AM   Pain improved.  Pt tolerating pos.  D/c'd home w/ percocet.  Return precautions discussed. 2:25 AM   Results for orders placed during the hospital encounter of 09/07/13  URINALYSIS, ROUTINE W REFLEX MICROSCOPIC      Result Value Range   Color, Urine YELLOW  YELLOW   APPearance CLEAR  CLEAR   Specific Gravity, Urine 1.028  1.005 - 1.030   pH 6.5  5.0 - 8.0   Glucose, UA NEGATIVE  NEGATIVE mg/dL   Hgb urine dipstick SMALL (*) NEGATIVE   Bilirubin Urine NEGATIVE  NEGATIVE   Ketones, ur NEGATIVE  NEGATIVE mg/dL   Protein, ur NEGATIVE  NEGATIVE mg/dL   Urobilinogen, UA 0.2  0.0 - 1.0 mg/dL   Nitrite NEGATIVE  NEGATIVE   Leukocytes, UA NEGATIVE  NEGATIVE  PREGNANCY, URINE      Result Value Range   Preg Test, Ur NEGATIVE  NEGATIVE  CBC WITH DIFFERENTIAL      Result Value Range   WBC 11.5 (*) 4.0 - 10.5 K/uL   RBC 4.19  3.87 - 5.11 MIL/uL   Hemoglobin 13.6  12.0 - 15.0 g/dL   HCT 13.239.2  44.036.0 - 10.246.0 %   MCV 93.6  78.0 - 100.0 fL   MCH 32.5  26.0 - 34.0 pg   MCHC 34.7  30.0 - 36.0 g/dL   RDW 72.512.1  36.611.5 - 44.015.5 %   Platelets 219  150 - 400 K/uL   Neutrophils Relative % 61  43 - 77 %   Neutro Abs 6.9  1.7 - 7.7 K/uL   Lymphocytes Relative 31  12 - 46 %   Lymphs Abs 3.6  0.7 - 4.0 K/uL   Monocytes Relative 5  3 - 12 %   Monocytes Absolute 0.6  0.1 - 1.0 K/uL   Eosinophils Relative 3  0 - 5 %   Eosinophils  Absolute 0.3  0.0 - 0.7 K/uL   Basophils Relative 0  0 - 1 %   Basophils Absolute 0.1  0.0 - 0.1 K/uL  COMPREHENSIVE METABOLIC PANEL      Result Value Range   Sodium 136 (*) 137 - 147 mEq/L   Potassium 4.1  3.7 - 5.3 mEq/L   Chloride 97  96 - 112 mEq/L   CO2 25  19 - 32 mEq/L   Glucose, Bld 95  70 - 99 mg/dL   BUN 18  6 - 23 mg/dL   Creatinine, Ser 3.470.91  0.50 - 1.10 mg/dL   Calcium 9.1  8.4 - 42.510.5 mg/dL   Total Protein 7.2  6.0 - 8.3 g/dL   Albumin 4.0  3.5 - 5.2 g/dL   AST 21  0 - 37 U/L   ALT 35  0 - 35 U/L   Alkaline Phosphatase 71  39 - 117 U/L   Total Bilirubin 0.5  0.3 -  1.2 mg/dL   GFR calc non Af Amer 85 (*) >90 mL/min   GFR calc Af Amer >90  >90 mL/min  LIPASE, BLOOD      Result Value Range   Lipase 24  11 - 59 U/L  URINE MICROSCOPIC-ADD ON      Result Value Range   Squamous Epithelial / LPF FEW (*) RARE   WBC, UA 0-2  <3 WBC/hpf   RBC / HPF 3-6  <3 RBC/hpf   Bacteria, UA RARE  RARE     Otilio Miu, PA-C 09/07/13 0225

## 2013-09-07 NOTE — ED Notes (Signed)
Bed: WA19 Expected date:  Expected time:  Means of arrival:  Comments: 

## 2013-09-11 ENCOUNTER — Encounter (INDEPENDENT_AMBULATORY_CARE_PROVIDER_SITE_OTHER): Payer: Self-pay | Admitting: Surgery

## 2013-09-11 ENCOUNTER — Encounter (INDEPENDENT_AMBULATORY_CARE_PROVIDER_SITE_OTHER): Payer: Self-pay

## 2013-09-11 ENCOUNTER — Other Ambulatory Visit (INDEPENDENT_AMBULATORY_CARE_PROVIDER_SITE_OTHER): Payer: Self-pay | Admitting: Surgery

## 2013-09-11 ENCOUNTER — Ambulatory Visit (INDEPENDENT_AMBULATORY_CARE_PROVIDER_SITE_OTHER): Payer: BC Managed Care – PPO | Admitting: Surgery

## 2013-09-11 VITALS — BP 126/72 | HR 81 | Temp 97.7°F | Resp 18 | Ht 66.0 in | Wt 236.0 lb

## 2013-09-11 DIAGNOSIS — K8020 Calculus of gallbladder without cholecystitis without obstruction: Secondary | ICD-10-CM

## 2013-09-11 NOTE — ED Provider Notes (Signed)
Medical screening examination/treatment/procedure(s) were conducted as a shared visit with non-physician practitioner(s) or resident  and myself.  I personally evaluated the patient during the encounter and agree with the findings and plan unless otherwise indicated.    I have personally reviewed any xrays and/ or EKG's with the provider and I agree with interpretation.   See separate note.  Enid SkeensJoshua M Kush [place], MD 09/11/13 1410

## 2013-09-11 NOTE — Progress Notes (Addendum)
Re:   Nicole Haley DOB:   [birth year] MRN:   [id number]  ASSESSMENT AND PLAN: 1.  Gall bladder disease and cholelithiais  I discussed with the patient the indications and risks of gall bladder surgery.  The primary risks of gall bladder surgery include, but are not limited to, bleeding, infection, common bile duct injury, and open surgery.  There is also the risk that the patient may have continued symptoms after surgery.  However, the likelihood of improvement in symptoms and return to the patient's normal status is good. We discussed the typical post-operative recovery course. I tried to answer the patient's questions.  I gave the patient literature about gall bladder surgery.  She wants to try to do this as an outpatient.  [Seen in ER on 10/26/2013 for umbilical suture inflammation.  DN 10/27/2013]  2.  Smokes, trying to quit 3.  Obese - BMI 38.1. [Gave Percocet - 5/325 - #30.  DN 09/17/2013]  Chief Complaint  Patient presents with  . New Evaluation    Chole   REFERRING PHYSICIAN: Herb Grays, MD  HISTORY OF PRESENT ILLNESS: Nicole Haley is a 28 y.o. (DOB: [birth year])  white  female whose primary care physician is SPEAR, TAMMY, MD and comes to me today for gall bladder disease.  Ms. Kurka has had some vague right back pain on and off for about a year, but she has some chronic back discomfort, so she did not think much about it.  Then in December 2014, she had severe RUQ and right back pain and went to [facility].  She has no family history of gall stones.  And she has had no prior abdominal surgery or GI symptoms.  She does notice that this pain comes with eating fatty foods.  She presented to [facility] ER originally 07/29/2013 with abdominal pain and saw Dr. Haywood Lasso.  US showed a large gall stone and was instructed to follow up with surgery. Then she represented 09/07/2013 with recurrent abdominal pain.  She is now here for evaluation.  Abdominal US - 07/29/2013 - 1. Large gallstone within the  gallbladder neck, potentially impacted. No specific signs of cholecystitis identified.  2. No biliary dilatation.  LFT's normal - 09/07/2013  Past Medical History  Diagnosis Date  . Anxiety attack     Past Surgical History  Procedure Laterality Date  . Dilation and curettage of uterus      Current Outpatient Prescriptions  Medication Sig Dispense Refill  . naproxen sodium (ANAPROX) 220 MG tablet Take 220 mg by mouth 2 (two) times daily as needed (pain).       Marland Kitchen oxyCODONE-acetaminophen (PERCOCET/ROXICET) 5-325 MG per tablet Take 1 tablet by mouth every 4 (four) hours as needed for severe pain.  20 tablet  0   No current facility-administered medications for this visit.     No Known Allergies  REVIEW OF SYSTEMS: Skin:  No history of rash.  No history of abnormal moles. Infection:  No history of hepatitis or HIV.  No history of MRSA. Neurologic:  Has occasional headaches, but has not sought medical care. Cardiac:  No history of hypertension. No history of heart disease.  No history of prior cardiac catheterization.  No history of seeing a cardiologist. Pulmonary:  Smokes about 1/2 ppd  Endocrine:  No diabetes. No thyroid disease. Gastrointestinal:  See HPI.  No history of stomach disease.  No history of liver disease.   No history of pancreas disease.  No history of colon disease. Urologic:  No history of kidney stones.  No history of bladder infections. Musculoskeletal:  Some vague chronic back pain, but has not seen physicians for this. Hematologic:  No bleeding disorder.  No history of anemia.  Not anticoagulated. Psycho-social:  The patient is oriented.    SOCIAL and FAMILY HISTORY: Divorced. 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  Tattoos right wrist and right hip. HEENT: Normal. Pupils equal. Neck: Supple. No mass.  No thyroid mass. Lymph Nodes:  No supraclavicular or cervical nodes. Lungs: Clear to auscultation and symmetric breath sounds. Heart:  RRR. No murmur or rub.  Abdomen: Soft. No mass. No tenderness today. No hernia. Normal bowel sounds.  No abdominal scars. Rectal: Not done. Extremities:  Good strength and ROM  in upper and lower extremities. Neurologic:  Grossly intact to motor and sensory function. Psychiatric: Has normal mood and affect. Behavior is normal.   DATA REVIEWED: Epic notes.  Ovidio Kinavid Mc Hollen, MD,  [facility] [address]  [address]   [place], [place]    [id number] Phone:  [phone] FAX:  [phone]

## 2013-09-17 ENCOUNTER — Telehealth (INDEPENDENT_AMBULATORY_CARE_PROVIDER_SITE_OTHER): Payer: Self-pay

## 2013-09-17 NOTE — Telephone Encounter (Signed)
Patient called stating she is still having pain daily from her GB and is out of pain medicine. She is scheduled for surgery on 2/6. Requesting refill of percocet. Please advise. She can be reached at [phone] or [phone]

## 2013-09-17 NOTE — Telephone Encounter (Signed)
Patient aware DR. 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

## 2013-09-19 ENCOUNTER — Encounter (HOSPITAL_COMMUNITY)
Admission: RE | Admit: 2013-09-19 | Discharge: 2013-09-19 | Disposition: A | Discharge status: Home / Self Care | Payer: BC Managed Care – PPO | Source: Ambulatory Visit | Attending: Surgery | Admitting: Surgery

## 2013-09-19 ENCOUNTER — Encounter (HOSPITAL_COMMUNITY): Payer: Self-pay | Admitting: Pharmacy Technician

## 2013-09-19 ENCOUNTER — Encounter (HOSPITAL_COMMUNITY): Payer: Self-pay

## 2013-09-19 HISTORY — DX: Gastro-esophageal reflux disease without esophagitis: K21.9

## 2013-09-19 HISTORY — DX: Anemia, unspecified: D64.9

## 2013-09-19 HISTORY — DX: Headache: R51

## 2013-09-19 HISTORY — DX: Calculus of gallbladder without cholecystitis without obstruction: K80.20

## 2013-09-19 HISTORY — DX: Sciatica, unspecified side: M54.30

## 2013-09-19 LAB — COMPREHENSIVE METABOLIC PANEL
ALBUMIN: 3.8 g/dL (ref 3.5–5.2)
ALT: 43 U/L — ABNORMAL HIGH (ref 0–35)
AST: 24 U/L (ref 0–37)
Alkaline Phosphatase: 64 U/L (ref 39–117)
BUN: 13 mg/dL (ref 6–23)
CO2: 25 mEq/L (ref 19–32)
Calcium: 9.3 mg/dL (ref 8.4–10.5)
Chloride: 100 mEq/L (ref 96–112)
Creatinine, Ser: 0.63 mg/dL (ref 0.50–1.10)
GFR calc Af Amer: >90 mL/min (ref >90)
Glucose, Bld: 98 mg/dL (ref 70–99)
Potassium: 4.6 mEq/L (ref 3.7–5.3)
Sodium: 136 mEq/L — ABNORMAL LOW (ref 137–147)
Total Bilirubin: 0.5 mg/dL (ref 0.3–1.2)
Total Protein: 7.2 g/dL (ref 6.0–8.3)

## 2013-09-19 LAB — CBC
HCT: 41.4 % (ref 36.0–46.0)
Hemoglobin: 14.1 g/dL (ref 12.0–15.0)
MCH: 32.4 pg (ref 26.0–34.0)
MCHC: 34.1 g/dL (ref 30.0–36.0)
MCV: 95.2 fL (ref 78.0–100.0)
PLATELETS: 210 10*3/uL (ref 150–400)
RBC: 4.35 MIL/uL (ref 3.87–5.11)
RDW: 12.4 % (ref 11.5–15.5)
WBC: 9.5 10*3/uL (ref 4.0–10.5)

## 2013-09-19 LAB — HCG, SERUM, QUALITATIVE: PREG SERUM: NEGATIVE

## 2013-09-19 NOTE — Patient Instructions (Signed)
Nicole KelpValerie Haley  09/19/2013                           YOUR PROCEDURE IS SCHEDULED ON: 09/26/13               PLEASE REPORT TO SHORT STAY CENTER AT : 8:00 AM               CALL THIS NUMBER IF ANY PROBLEMS THE DAY OF SURGERY :               [phone]                   NO VISITORS UNDER AGE 28 DUE TO FLU RESTRICTIONS                REMEMBER:   Do not eat food or drink liquids AFTER MIDNIGHT    Take these medicines the morning of surgery with A SIP OF WATER: PERCOCET IF NEEDED   Do not wear jewelry, make-up   Do not wear lotions, powders, or perfumes.   Do not shave legs or underarms 12 hrs. before surgery (men may shave face)  Do not bring valuables to the hospital.  Contacts, dentures or bridgework may not be worn into surgery.  Leave suitcase in the car. After surgery it may be brought to your room.  For patients admitted to the hospital more than one night, checkout time is 11:00                          The day of discharge.   Patients discharged the day of surgery will not be allowed to drive home                             If going home same day of surgery, must have someone stay with you first                           24 hrs at home and arrange for some one to drive you home from hospital.    Special Instructions:   Please read over the following fact sheets that you were given:               1. [facility] PREPARING FOR SURGERY SHEET                                                X_____________________________________________________________________        Failure to follow these instructions may result in cancellation of your surgery

## 2013-09-22 ENCOUNTER — Observation Stay (HOSPITAL_COMMUNITY): Payer: BC Managed Care – PPO

## 2013-09-22 ENCOUNTER — Observation Stay (HOSPITAL_COMMUNITY)
Admission: EM | Admit: 2013-09-22 | Discharge: 2013-09-23 | Disposition: A | Discharge status: Home / Self Care | Payer: BC Managed Care – PPO | Attending: General Surgery | Admitting: General Surgery

## 2013-09-22 ENCOUNTER — Encounter (HOSPITAL_COMMUNITY): Payer: Self-pay | Admitting: Emergency Medicine

## 2013-09-22 ENCOUNTER — Encounter (HOSPITAL_COMMUNITY): Payer: BC Managed Care – PPO | Admitting: Anesthesiology

## 2013-09-22 ENCOUNTER — Observation Stay (HOSPITAL_COMMUNITY): Payer: BC Managed Care – PPO | Admitting: Anesthesiology

## 2013-09-22 ENCOUNTER — Encounter (HOSPITAL_COMMUNITY)
Admission: EM | Disposition: A | Discharge status: Home / Self Care | Payer: Self-pay | Source: Home / Self Care | Attending: Emergency Medicine

## 2013-09-22 DIAGNOSIS — F418 Other specified anxiety disorders: Secondary | ICD-10-CM

## 2013-09-22 DIAGNOSIS — K219 Gastro-esophageal reflux disease without esophagitis: Secondary | ICD-10-CM | POA: Insufficient documentation

## 2013-09-22 DIAGNOSIS — R1013 Epigastric pain: Secondary | ICD-10-CM

## 2013-09-22 DIAGNOSIS — K8020 Calculus of gallbladder without cholecystitis without obstruction: Secondary | ICD-10-CM

## 2013-09-22 DIAGNOSIS — F17200 Nicotine dependence, unspecified, uncomplicated: Secondary | ICD-10-CM | POA: Insufficient documentation

## 2013-09-22 DIAGNOSIS — K8010 Calculus of gallbladder with chronic cholecystitis without obstruction: Secondary | ICD-10-CM

## 2013-09-22 DIAGNOSIS — E669 Obesity, unspecified: Secondary | ICD-10-CM | POA: Insufficient documentation

## 2013-09-22 DIAGNOSIS — R109 Unspecified abdominal pain: Secondary | ICD-10-CM | POA: Insufficient documentation

## 2013-09-22 DIAGNOSIS — R110 Nausea: Secondary | ICD-10-CM | POA: Insufficient documentation

## 2013-09-22 DIAGNOSIS — K819 Cholecystitis, unspecified: Secondary | ICD-10-CM | POA: Diagnosis present

## 2013-09-22 DIAGNOSIS — F341 Dysthymic disorder: Secondary | ICD-10-CM | POA: Insufficient documentation

## 2013-09-22 DIAGNOSIS — K812 Acute cholecystitis with chronic cholecystitis: Secondary | ICD-10-CM | POA: Diagnosis present

## 2013-09-22 DIAGNOSIS — D72829 Elevated white blood cell count, unspecified: Secondary | ICD-10-CM | POA: Insufficient documentation

## 2013-09-22 HISTORY — PX: CHOLECYSTECTOMY: SHX55

## 2013-09-22 LAB — CBC WITH DIFFERENTIAL/PLATELET
BASOS ABS: 0 10*3/uL (ref 0.0–0.1)
Basophils Relative: 0 % (ref 0–1)
Eosinophils Absolute: 0.3 10*3/uL (ref 0.0–0.7)
Eosinophils Relative: 2 % (ref 0–5)
HEMATOCRIT: 41.7 % (ref 36.0–46.0)
Hemoglobin: 14.6 g/dL (ref 12.0–15.0)
LYMPHS PCT: 29 % (ref 12–46)
Lymphs Abs: 3.2 10*3/uL (ref 0.7–4.0)
MCH: 32.2 pg (ref 26.0–34.0)
MCHC: 35 g/dL (ref 30.0–36.0)
MCV: 92.1 fL (ref 78.0–100.0)
Monocytes Absolute: 0.7 10*3/uL (ref 0.1–1.0)
Monocytes Relative: 6 % (ref 3–12)
Neutro Abs: 7 10*3/uL (ref 1.7–7.7)
Neutrophils Relative %: 63 % (ref 43–77)
PLATELETS: 206 10*3/uL (ref 150–400)
RBC: 4.53 MIL/uL (ref 3.87–5.11)
RDW: 12.2 % (ref 11.5–15.5)
WBC: 11.1 10*3/uL — AB (ref 4.0–10.5)

## 2013-09-22 LAB — COMPREHENSIVE METABOLIC PANEL
ALT: 39 U/L — AB (ref 0–35)
AST: 22 U/L (ref 0–37)
Albumin: 4.1 g/dL (ref 3.5–5.2)
Alkaline Phosphatase: 67 U/L (ref 39–117)
BILIRUBIN TOTAL: 0.7 mg/dL (ref 0.3–1.2)
BUN: 12 mg/dL (ref 6–23)
CHLORIDE: 101 meq/L (ref 96–112)
CO2: 20 meq/L (ref 19–32)
Calcium: 9.1 mg/dL (ref 8.4–10.5)
Creatinine, Ser: 0.66 mg/dL (ref 0.50–1.10)
GFR calc Af Amer: >90 mL/min (ref >90)
Glucose, Bld: 90 mg/dL (ref 70–99)
Potassium: 3.9 mEq/L (ref 3.7–5.3)
SODIUM: 138 meq/L (ref 137–147)
Total Protein: 7 g/dL (ref 6.0–8.3)

## 2013-09-22 LAB — LIPASE, BLOOD: Lipase: 13 U/L (ref 11–59)

## 2013-09-22 SURGERY — LAPAROSCOPIC CHOLECYSTECTOMY WITH INTRAOPERATIVE CHOLANGIOGRAM
Anesthesia: General | Site: Abdomen

## 2013-09-22 MED ORDER — GLYCOPYRROLATE 0.2 MG/ML IJ SOLN
INTRAMUSCULAR | Status: AC
  Filled 2013-09-22: qty 3

## 2013-09-22 MED ORDER — ONDANSETRON HCL 4 MG/2ML IJ SOLN
4.0000 mg | Freq: Once | INTRAMUSCULAR | Status: AC
  Administered 2013-09-22: 4 mg via INTRAVENOUS
  Filled 2013-09-22: qty 2

## 2013-09-22 MED ORDER — ONDANSETRON HCL 4 MG/2ML IJ SOLN
INTRAMUSCULAR | Status: AC
  Filled 2013-09-22: qty 2

## 2013-09-22 MED ORDER — HYDROMORPHONE HCL PF 1 MG/ML IJ SOLN
1.0000 mg | Freq: Once | INTRAMUSCULAR | Status: AC
  Administered 2013-09-22: 1 mg via INTRAVENOUS
  Filled 2013-09-22: qty 1

## 2013-09-22 MED ORDER — HYDROMORPHONE HCL PF 1 MG/ML IJ SOLN
0.2500 mg | INTRAMUSCULAR | Status: DC | PRN
  Administered 2013-09-22 (×4): 0.5 mg via INTRAVENOUS

## 2013-09-22 MED ORDER — SUCCINYLCHOLINE CHLORIDE 20 MG/ML IJ SOLN
INTRAMUSCULAR | Status: DC | PRN
  Administered 2013-09-22: 120 mg via INTRAVENOUS

## 2013-09-22 MED ORDER — GLYCOPYRROLATE 0.2 MG/ML IJ SOLN
INTRAMUSCULAR | Status: DC | PRN
  Administered 2013-09-22: 0.6 mg via INTRAVENOUS

## 2013-09-22 MED ORDER — DIPHENHYDRAMINE HCL 50 MG/ML IJ SOLN: 12.5000 mg | Freq: Four times a day (QID) | INTRAMUSCULAR | Status: DC | PRN

## 2013-09-22 MED ORDER — MIDAZOLAM HCL 2 MG/2ML IJ SOLN
INTRAMUSCULAR | Status: AC
  Filled 2013-09-22: qty 2

## 2013-09-22 MED ORDER — NEOSTIGMINE METHYLSULFATE 1 MG/ML IJ SOLN
INTRAMUSCULAR | Status: AC
  Filled 2013-09-22: qty 10

## 2013-09-22 MED ORDER — FENTANYL CITRATE 0.05 MG/ML IJ SOLN
INTRAMUSCULAR | Status: AC
  Filled 2013-09-22: qty 2

## 2013-09-22 MED ORDER — ROCURONIUM BROMIDE 100 MG/10ML IV SOLN
INTRAVENOUS | Status: AC
  Filled 2013-09-22: qty 1

## 2013-09-22 MED ORDER — PANTOPRAZOLE SODIUM 40 MG IV SOLR
40.0000 mg | Freq: Every day | INTRAVENOUS | Status: DC
  Filled 2013-09-22: qty 40

## 2013-09-22 MED ORDER — ONDANSETRON HCL 4 MG PO TABS: 4.0000 mg | ORAL_TABLET | Freq: Four times a day (QID) | ORAL | Status: DC | PRN

## 2013-09-22 MED ORDER — HYDROMORPHONE HCL PF 1 MG/ML IJ SOLN
INTRAMUSCULAR | Status: AC
  Filled 2013-09-22: qty 1

## 2013-09-22 MED ORDER — MORPHINE SULFATE 2 MG/ML IJ SOLN
1.0000 mg | INTRAMUSCULAR | Status: DC | PRN
  Administered 2013-09-22: 2 mg via INTRAVENOUS
  Filled 2013-09-22: qty 1

## 2013-09-22 MED ORDER — BUPIVACAINE HCL (PF) 0.25 % IJ SOLN
INTRAMUSCULAR | Status: AC
  Filled 2013-09-22: qty 30

## 2013-09-22 MED ORDER — OXYCODONE-ACETAMINOPHEN 5-325 MG PO TABS
1.0000 | ORAL_TABLET | ORAL | Status: DC | PRN
  Administered 2013-09-22 – 2013-09-23 (×3): 1 via ORAL
  Filled 2013-09-22 (×3): qty 1

## 2013-09-22 MED ORDER — LACTATED RINGERS IR SOLN
Status: DC | PRN
  Administered 2013-09-22: 1000 mL

## 2013-09-22 MED ORDER — ONDANSETRON HCL 4 MG/2ML IJ SOLN: 4.0000 mg | Freq: Four times a day (QID) | INTRAMUSCULAR | Status: DC | PRN

## 2013-09-22 MED ORDER — PROPOFOL 10 MG/ML IV BOLUS
INTRAVENOUS | Status: DC | PRN
  Administered 2013-09-22: 200 mg via INTRAVENOUS

## 2013-09-22 MED ORDER — LACTATED RINGERS IV SOLN
INTRAVENOUS | Status: DC | PRN
  Administered 2013-09-22 (×2): via INTRAVENOUS

## 2013-09-22 MED ORDER — 0.9 % SODIUM CHLORIDE (POUR BTL) OPTIME
TOPICAL | Status: DC | PRN
  Administered 2013-09-22: 1000 mL

## 2013-09-22 MED ORDER — KCL IN DEXTROSE-NACL 20-5-0.45 MEQ/L-%-% IV SOLN
INTRAVENOUS | Status: DC
  Administered 2013-09-22 – 2013-09-23 (×2): via INTRAVENOUS
  Filled 2013-09-22 (×3): qty 1000

## 2013-09-22 MED ORDER — BUPIVACAINE HCL (PF) 0.25 % IJ SOLN
INTRAMUSCULAR | Status: DC | PRN
  Administered 2013-09-22: 30 mL

## 2013-09-22 MED ORDER — SUCCINYLCHOLINE CHLORIDE 20 MG/ML IJ SOLN
INTRAMUSCULAR | Status: AC
  Filled 2013-09-22: qty 2

## 2013-09-22 MED ORDER — FENTANYL CITRATE 0.05 MG/ML IJ SOLN
INTRAMUSCULAR | Status: DC | PRN
  Administered 2013-09-22: 50 ug via INTRAVENOUS
  Administered 2013-09-22: 100 ug via INTRAVENOUS
  Administered 2013-09-22: 50 ug via INTRAVENOUS
  Administered 2013-09-22: 100 ug via INTRAVENOUS
  Administered 2013-09-22: 50 ug via INTRAVENOUS

## 2013-09-22 MED ORDER — PROPOFOL 10 MG/ML IV BOLUS
INTRAVENOUS | Status: AC
  Filled 2013-09-22: qty 20

## 2013-09-22 MED ORDER — LACTATED RINGERS IV SOLN: INTRAVENOUS | Status: DC

## 2013-09-22 MED ORDER — NEOSTIGMINE METHYLSULFATE 1 MG/ML IJ SOLN
INTRAMUSCULAR | Status: DC | PRN
  Administered 2013-09-22: 5 mg via INTRAVENOUS

## 2013-09-22 MED ORDER — GLYCOPYRROLATE 0.2 MG/ML IJ SOLN
INTRAMUSCULAR | Status: AC
  Filled 2013-09-22: qty 2

## 2013-09-22 MED ORDER — ONDANSETRON HCL 4 MG/2ML IJ SOLN
INTRAMUSCULAR | Status: DC | PRN
  Administered 2013-09-22: 400 mg via INTRAVENOUS
  Administered 2013-09-22: 4 mg via INTRAVENOUS

## 2013-09-22 MED ORDER — HYDROMORPHONE HCL PF 1 MG/ML IJ SOLN
0.5000 mg | INTRAMUSCULAR | Status: DC | PRN
  Administered 2013-09-22 – 2013-09-23 (×5): 1.5 mg via INTRAVENOUS
  Administered 2013-09-23: 1 mg via INTRAVENOUS
  Filled 2013-09-22 (×3): qty 2
  Filled 2013-09-22: qty 1
  Filled 2013-09-22 (×2): qty 2

## 2013-09-22 MED ORDER — HYDROCODONE-ACETAMINOPHEN 5-325 MG PO TABS
1.0000 | ORAL_TABLET | ORAL | Status: DC | PRN
  Administered 2013-09-22: 2 via ORAL
  Filled 2013-09-22: qty 2

## 2013-09-22 MED ORDER — SODIUM CHLORIDE 0.9 % IV SOLN
3.0000 g | Freq: Once | INTRAVENOUS | Status: AC
  Administered 2013-09-22: 3 g via INTRAVENOUS
  Filled 2013-09-22: qty 3

## 2013-09-22 MED ORDER — SODIUM CHLORIDE 0.9 % IV SOLN
3.0000 g | Freq: Four times a day (QID) | INTRAVENOUS | Status: DC
  Administered 2013-09-22 – 2013-09-23 (×2): 3 g via INTRAVENOUS
  Filled 2013-09-22 (×3): qty 3

## 2013-09-22 MED ORDER — MIDAZOLAM HCL 5 MG/5ML IJ SOLN
INTRAMUSCULAR | Status: DC | PRN
  Administered 2013-09-22: 2 mg via INTRAVENOUS

## 2013-09-22 MED ORDER — LIDOCAINE HCL (CARDIAC) 20 MG/ML IV SOLN
INTRAVENOUS | Status: AC
  Filled 2013-09-22: qty 5

## 2013-09-22 MED ORDER — IOHEXOL 300 MG/ML  SOLN
INTRAMUSCULAR | Status: DC | PRN
  Administered 2013-09-22: 4 mL

## 2013-09-22 MED ORDER — FENTANYL CITRATE 0.05 MG/ML IJ SOLN
INTRAMUSCULAR | Status: AC
  Filled 2013-09-22: qty 5

## 2013-09-22 MED ORDER — FENTANYL CITRATE 0.05 MG/ML IJ SOLN
25.0000 ug | INTRAMUSCULAR | Status: DC | PRN
Start: 2013-09-22 — End: 2013-09-22
  Administered 2013-09-22: 50 ug via INTRAVENOUS

## 2013-09-22 MED ORDER — SODIUM CHLORIDE 0.9 % IV SOLN
INTRAVENOUS | Status: DC
  Administered 2013-09-22: 15:00:00 via INTRAVENOUS

## 2013-09-22 MED ORDER — ONDANSETRON HCL 4 MG/2ML IJ SOLN
4.0000 mg | Freq: Four times a day (QID) | INTRAMUSCULAR | Status: DC | PRN
  Administered 2013-09-22: 4 mg via INTRAVENOUS

## 2013-09-22 MED ORDER — SODIUM CHLORIDE 0.9 % IV BOLUS (SEPSIS)
1000.0000 mL | Freq: Once | INTRAVENOUS | Status: AC
  Administered 2013-09-22: 1000 mL via INTRAVENOUS

## 2013-09-22 MED ORDER — HEPARIN SODIUM (PORCINE) 5000 UNIT/ML IJ SOLN
5000.0000 [IU] | Freq: Three times a day (TID) | INTRAMUSCULAR | Status: DC
  Administered 2013-09-23: 5000 [IU] via SUBCUTANEOUS
  Filled 2013-09-22 (×4): qty 1

## 2013-09-22 MED ORDER — DIPHENHYDRAMINE HCL 12.5 MG/5ML PO ELIX: 12.5000 mg | ORAL_SOLUTION | Freq: Four times a day (QID) | ORAL | Status: DC | PRN

## 2013-09-22 MED ORDER — DEXAMETHASONE SODIUM PHOSPHATE 10 MG/ML IJ SOLN
INTRAMUSCULAR | Status: DC | PRN
  Administered 2013-09-22: 10 mg via INTRAVENOUS

## 2013-09-22 MED ORDER — HYDROMORPHONE HCL PF 1 MG/ML IJ SOLN: 1.0000 mg | INTRAMUSCULAR | Status: DC | PRN

## 2013-09-22 MED ORDER — ROCURONIUM BROMIDE 100 MG/10ML IV SOLN
INTRAVENOUS | Status: DC | PRN
  Administered 2013-09-22: 35 mg via INTRAVENOUS
  Administered 2013-09-22 (×2): 5 mg via INTRAVENOUS

## 2013-09-22 SURGICAL SUPPLY — 37 items
APPLIER CLIP ROT 10 11.4 M/L (STAPLE) ×6
BENZOIN TINCTURE PRP APPL 2/3 (GAUZE/BANDAGES/DRESSINGS) ×3 IMPLANT
CANISTER SUCTION 2500CC (MISCELLANEOUS) ×3 IMPLANT
CHLORAPREP W/TINT 26ML (MISCELLANEOUS) ×3 IMPLANT
CHOLANGIOGRAM CATH TAUT (CATHETERS) ×3 IMPLANT
CLIP APPLIE ROT 10 11.4 M/L (STAPLE) ×2 IMPLANT
CLOSURE WOUND 1/4X4 (GAUZE/BANDAGES/DRESSINGS) ×1
COVER MAYO STAND STRL (DRAPES) IMPLANT
DECANTER SPIKE VIAL GLASS SM (MISCELLANEOUS) ×3 IMPLANT
DERMABOND ADVANCED (GAUZE/BANDAGES/DRESSINGS) ×2
DERMABOND ADVANCED .7 DNX12 (GAUZE/BANDAGES/DRESSINGS) ×1 IMPLANT
DRAPE C-ARM 42X120 X-RAY (DRAPES) ×3 IMPLANT
DRAPE LAPAROSCOPIC ABDOMINAL (DRAPES) ×3 IMPLANT
DRAPE UTILITY XL STRL (DRAPES) ×3 IMPLANT
ELECT REM PT RETURN 9FT ADLT (ELECTROSURGICAL) ×3
ELECTRODE REM PT RTRN 9FT ADLT (ELECTROSURGICAL) ×1 IMPLANT
GLOVE SURG SIGNA 7.5 PF LTX (GLOVE) ×3 IMPLANT
GOWN STRL REUS W/TWL XL LVL3 (GOWN DISPOSABLE) ×9 IMPLANT
HEMOSTAT SURGICEL 4X8 (HEMOSTASIS) IMPLANT
IV CATH 14GX2 1/4 (CATHETERS) ×3 IMPLANT
IV SET MACRO CATH EXT 6 LUER (IV SETS) ×3 IMPLANT
KIT BASIN OR (CUSTOM PROCEDURE TRAY) ×3 IMPLANT
NS IRRIG 1000ML POUR BTL (IV SOLUTION) IMPLANT
POUCH SPECIMEN RETRIEVAL 10MM (ENDOMECHANICALS) ×3 IMPLANT
SET IRRIG TUBING LAPAROSCOPIC (IRRIGATION / IRRIGATOR) ×3 IMPLANT
SLEEVE XCEL OPT CAN 5 100 (ENDOMECHANICALS) ×3 IMPLANT
SOLUTION ANTI FOG 6CC (MISCELLANEOUS) ×3 IMPLANT
STOPCOCK 4 WAY LG BORE MALE ST (IV SETS) ×3 IMPLANT
STRIP CLOSURE SKIN 1/4X4 (GAUZE/BANDAGES/DRESSINGS) ×2 IMPLANT
SUT VIC AB 5-0 PS2 18 (SUTURE) ×3 IMPLANT
TOWEL OR 17X26 10 PK STRL BLUE (TOWEL DISPOSABLE) ×3 IMPLANT
TOWEL OR NON WOVEN STRL DISP B (DISPOSABLE) ×3 IMPLANT
TRAY LAP CHOLE (CUSTOM PROCEDURE TRAY) ×3 IMPLANT
TROCAR BLADELESS OPT 5 100 (ENDOMECHANICALS) ×3 IMPLANT
TROCAR XCEL BLUNT TIP 100MML (ENDOMECHANICALS) ×3 IMPLANT
TROCAR XCEL NON-BLD 11X100MML (ENDOMECHANICALS) ×3 IMPLANT
TUBING INSUFFLATION 10FT LAP (TUBING) ×3 IMPLANT

## 2013-09-22 NOTE — ED Provider Notes (Signed)
CSN: [id number]     Arrival date & time 09/22/13  1122 History   First MD Initiated Contact with Patient 09/22/13 1155     Chief Complaint  Patient presents with  . Cholelithiasis   (Consider location/radiation/quality/duration/timing/severity/associated sxs/prior Treatment) The history is provided by the patient.   Nicole Haley is a 28 y.o. female presents for evaluation of ongoing right upper quadrant pain and is on resolved with current pain medications, Percocet. She's been followed for gallbladder disease. She has a pending cholecystectomy, in 4 days. She denies fever, chills, cough, shortness of breath, or chest pain. She has nausea and decreased appetite. There are no other known modifying factors..  Past Medical History  Diagnosis Date  . Headache(784.0)   . Gallstones   . GERD (gastroesophageal reflux disease)   . Anemia     low iron? never been able to give blood, but not diagnosed  . Sciatica    Past Surgical History  Procedure Laterality Date  . Dilation and curettage of uterus  2009    spontaneous abortion  . Wisdom tooth extraction  2006    x 4  . Dilation and curettage of uterus  2006   Family History  Problem Relation Age of Onset  . Diabetes Mother   . Hypertension Mother   . Hypertension Father    History  Substance Use Topics  . Smoking status: Current Every Day Smoker -- 0.50 packs/day for 15 years    Types: Cigarettes  . Smokeless tobacco: Never Used  . Alcohol Use: Yes     Comment: twice a week 1-2 glasses of wine   OB History   Grav Para Term Preterm Abortions TAB SAB Ect Mult Living                 Review of Systems  All other systems reviewed and are negative.    Allergies  Review of patient's allergies indicates no known allergies.  Home Medications   No current outpatient prescriptions on file. BP 140/73  Pulse 71  Temp(Src) 98.8 F (37.1 C) (Oral)  Resp 18  Ht 5\' 6"  (1.676 m)  Wt 243 lb 9.7 oz (110.5 kg)  BMI 39.34 kg/m2   SpO2 99%  LMP 08/27/2013 Physical Exam  Nursing note and vitals reviewed. Constitutional: She is oriented to person, place, and time. She appears well-developed. She appears distressed (Uncomfortable, tearful).  Obese  HENT:  Head: Normocephalic and atraumatic.  Eyes: Conjunctivae and EOM are normal. Pupils are equal, round, and reactive to light.  Neck: Normal range of motion and phonation normal. Neck supple.  Cardiovascular: Normal rate, regular rhythm and intact distal pulses.   Pulmonary/Chest: Effort normal and breath sounds normal. She exhibits no tenderness.  Abdominal: Soft. She exhibits no distension and no mass. There is tenderness (Right upper quadrant, moderate). There is no rebound and no guarding.  Musculoskeletal: Normal range of motion.  Neurological: She is alert and oriented to person, place, and time. She exhibits normal muscle tone.  Skin: Skin is warm and dry.  Psychiatric: She has a normal mood and affect. Her behavior is normal. Judgment and thought content normal.    ED Course  Procedures (including critical care time)  Medications  HYDROmorphone (DILAUDID) 1 MG/ML injection (not administered)  oxyCODONE-acetaminophen (PERCOCET/ROXICET) 5-325 MG per tablet 1 tablet (1 tablet Oral Given 09/22/13 2003)  morphine 2 MG/ML injection 1-3 mg (2 mg Intravenous Given 09/22/13 1858)  ondansetron (ZOFRAN) tablet 4 mg (not administered)    Or  ondansetron (ZOFRAN) injection 4 mg (not administered)  heparin injection 5,000 Units (not administered)  dextrose 5 % and 0.45 % NaCl with KCl 20 mEq/L infusion ( Intravenous New Bag/Given 09/22/13 2018)  Ampicillin-Sulbactam (UNASYN) 3 g in sodium chloride 0.9 % 100 mL IVPB (not administered)  HYDROmorphone (DILAUDID) 1 MG/ML injection (not administered)  ondansetron (ZOFRAN) 4 MG/2ML injection (not administered)  fentaNYL (SUBLIMAZE) 0.05 MG/ML injection (not administered)  sodium chloride 0.9 % bolus 1,000 mL (0 mLs Intravenous  Stopped 09/22/13 1445)  ondansetron (ZOFRAN) injection 4 mg (4 mg Intravenous Given 09/22/13 1237)  HYDROmorphone (DILAUDID) injection 1 mg (1 mg Intravenous Given 09/22/13 1312)  Ampicillin-Sulbactam (UNASYN) 3 g in sodium chloride 0.9 % 100 mL IVPB (3 g Intravenous New Bag/Given 09/22/13 1501)    Patient Vitals for the past 24 hrs:  BP Temp Temp src Pulse Resp SpO2 Height Weight  09/22/13 2000 - - - - - - 5\' 6"  (1.676 m) 243 lb 9.7 oz (110.5 kg)  09/22/13 1845 140/73 mmHg 98.8 F (37.1 C) - 71 18 99 % - -  09/22/13 1830 135/65 mmHg 98 F (36.7 C) - - 16 - - -  09/22/13 1815 - - - 69 - - - -  09/22/13 1800 135/63 mmHg - - - 28 - - -  09/22/13 1745 - - - - 34 - - -  09/22/13 1739 137/65 mmHg 97.8 F (36.6 C) - 80 22 99 % - -  09/22/13 1127 128/81 mmHg 97.8 F (36.6 C) Oral 82 16 97 % - -       Labs Review Labs Reviewed  CBC WITH DIFFERENTIAL - Abnormal; Notable for the following:    WBC 11.1 (*)    All other components within normal limits  COMPREHENSIVE METABOLIC PANEL - Abnormal; Notable for the following:    ALT 39 (*)    All other components within normal limits  LIPASE, BLOOD   Imaging Review Dg Cholangiogram Operative  09/22/2013   CLINICAL DATA:  Cholelithiasis.  Abdominal pain.  EXAM: INTRAOPERATIVE CHOLANGIOGRAM  TECHNIQUE: Cholangiographic images from the C-arm fluoroscopic device were submitted for interpretation post-operatively. Please see the procedural report for the amount of contrast and the fluoroscopy time utilized.  COMPARISON:  None.  FINDINGS: No evidence of dilatation or obstruction of the common bile duct. Prompt contrast emptying at into the duodenum is seen. No filling defects are seen within the common bile duct to suggest choledocholithiasis.  IMPRESSION: Negative. No evidence of common bile duct dilatation or obstruction.   Electronically Signed   By: Myles RosenthalJohn  Stahl M.D.   On: 09/22/2013 17:20      MDM   1. Abdominal pain   2. Cholelithiasis   3. GERD  (gastroesophageal reflux disease)    Symptomatic gall stones. Not improving with OP treatment  Nursing Notes Reviewed/ Care Coordinated Applicable Imaging Reviewed Interpretation of Laboratory Data incorporated into ED treatment   Plan: Admit    Flint MelterElliott L Starlit Raburn, MD 09/22/13 2034

## 2013-09-22 NOTE — Preoperative (Signed)
Beta Blockers   Reason not to administer Beta Blockers:Not Applicable 

## 2013-09-22 NOTE — H&P (Signed)
Nicole Haley 09/19/1985  [id number].    Requesting MD: Dr. Eulis Foster Chief Complaint/Reason for Consult: Symptomatic cholelithiasis  HPI:  28 y/o white female presents to [facility] hospital with RUQ/epigastric abdominal pain and nausea which has worsened over the last few days.  C/o feeling feverish, chills, mid back pain, nausea, but no vomiting.  She was pending surgery with Dr. Lucia Gaskins on Friday for lap chole, but the pain has become so severe and constant that the percocet was not controlling her pain.  Her pain was originally exacerbated with foods, but now is constant and unrelated to foods.  She was worried she may have passed a stone and came to the hospital to be further evaluated.  She tried hot/cold packs without relief.    ROS: All systems reviewed and otherwise negative except for as above  Family History  Problem Relation Age of Onset  . Diabetes Mother   . Hypertension Mother   . Hypertension Father     Past Medical History  Diagnosis Date  . Headache(784.0)   . Gallstones   . GERD (gastroesophageal reflux disease)   . Anemia     low iron? never been able to give blood, but not diagnosed  . Sciatica     Past Surgical History  Procedure Laterality Date  . Dilation and curettage of uterus  2009    spontaneous abortion  . Wisdom tooth extraction  2006    x 4  . Dilation and curettage of uterus  2006    Social History:  reports that she has been smoking Cigarettes.  She has a 7.5 pack-year smoking history. She has never used smokeless tobacco. She reports that she drinks alcohol. She reports that she uses illicit drugs (Marijuana).  Allergies: No Known Allergies   (Not in a hospital admission)  Blood pressure 128/81, pulse 82, temperature 97.8 F (36.6 C), temperature source Oral, resp. rate 16, last menstrual period 08/27/2013, SpO2 97.00%. Physical Exam: General: mildly distressed, tearful due to pain, WD/WN white female who is laying in bed HEENT: head is  normocephalic, atraumatic.  Sclera are noninjected.  PERRL.  Ears and nose without any masses or lesions.  Mouth is pink and moist Heart: regular, rate, and rhythm.  No obvious murmurs, gallops, or rubs noted.  Palpable pedal pulses bilaterally Lungs: CTAB, no wheezes, rhonchi, or rales noted.  Respiratory effort nonlabored Abd: Obese, soft, moderate tenderness in RUQ and epigastrium, ND, +BS, no masses, hernias, or organomegaly, no abdominal scars noted MS: all 4 extremities are symmetrical with no cyanosis, clubbing, or edema. Skin: warm and dry with no masses, lesions, or rashes Psych: A&Ox3 with an appropriate affect. Neuro: Extremity CSM intact bilaterally, normal speech  Results for orders placed during the hospital encounter of 09/22/13 (from the past 48 hour(s))  CBC WITH DIFFERENTIAL     Status: Abnormal   Collection Time    09/22/13 12:25 PM      Result Value Range   WBC 11.1 (*) 4.0 - 10.5 K/uL   RBC 4.53  3.87 - 5.11 MIL/uL   Hemoglobin 14.6  12.0 - 15.0 g/dL   HCT 41.7  36.0 - 46.0 %   MCV 92.1  78.0 - 100.0 fL   MCH 32.2  26.0 - 34.0 pg   MCHC 35.0  30.0 - 36.0 g/dL   RDW 12.2  11.5 - 15.5 %   Platelets 206  150 - 400 K/uL   Neutrophils Relative % 63  43 - 77 %   Neutro  Abs 7.0  1.7 - 7.7 K/uL   Lymphocytes Relative 29  12 - 46 %   Lymphs Abs 3.2  0.7 - 4.0 K/uL   Monocytes Relative 6  3 - 12 %   Monocytes Absolute 0.7  0.1 - 1.0 K/uL   Eosinophils Relative 2  0 - 5 %   Eosinophils Absolute 0.3  0.0 - 0.7 K/uL   Basophils Relative 0  0 - 1 %   Basophils Absolute 0.0  0.0 - 0.1 K/uL  COMPREHENSIVE METABOLIC PANEL     Status: Abnormal   Collection Time    09/22/13 12:25 PM      Result Value Range   Sodium 138  137 - 147 mEq/L   Potassium 3.9  3.7 - 5.3 mEq/L   Chloride 101  96 - 112 mEq/L   CO2 20  19 - 32 mEq/L   Glucose, Bld 90  70 - 99 mg/dL   BUN 12  6 - 23 mg/dL   Creatinine, Ser 0.66  0.50 - 1.10 mg/dL   Calcium 9.1  8.4 - 10.5 mg/dL   Total Protein 7.0   6.0 - 8.3 g/dL   Albumin 4.1  3.5 - 5.2 g/dL   AST 22  0 - 37 U/L   ALT 39 (*) 0 - 35 U/L   Alkaline Phosphatase 67  39 - 117 U/L   Total Bilirubin 0.7  0.3 - 1.2 mg/dL   GFR calc non Af Amer >90  >90 mL/min   GFR calc Af Amer >90  >90 mL/min   Comment: (NOTE)     The eGFR has been calculated using the CKD EPI equation.     This calculation has not been validated in all clinical situations.     eGFR's persistently <90 mL/min signify possible Chronic Kidney     Disease.  LIPASE, BLOOD     Status: None   Collection Time    09/22/13 12:25 PM      Result Value Range   Lipase 13  11 - 59 U/L   No results found.    Assessment/Plan Symptomatic cholelithiasis, probably cholecystitis Leukocytosis RUQ/epigastric abdominal pain Nausea  Plan: 1.  Admit to [facility] 2.  NPO, bowel rest, IVF, pain control, antiemetics, antibiotics (Unasyn Day 1) 3.  SCD's and Hold pharm dvt prophylaxis 4.  Ambulate and IS 5.  Dr. Lucia Gaskins or Dr. Johney Maine to take to OR today for for Urgent lap chole with IOC 6.  Hopefully home tomorrow if all goes well.   Coralie Keens, [facility] 09/22/2013, 2:07 PM Pager: [phone]  To OR  Alphonsa Overall, MD, [facility] Pager: [phone] Office phone:  [phone]

## 2013-09-22 NOTE — Transfer of Care (Signed)
Immediate Anesthesia Transfer of Care Note  Patient: Nicole Haley  Procedure(s) Performed: Procedure(s): LAPAROSCOPIC CHOLECYSTECTOMY WITH INTRAOPERATIVE CHOLANGIOGRAM (N/A)  Patient Location: PACU  Anesthesia Type:General  Level of Consciousness: sedated  Airway & Oxygen Therapy: Patient Spontanous Breathing and Patient connected to face mask oxygen  Post-op Assessment: Report given to PACU RN and Post -op Vital signs reviewed and stable  Post vital signs: Reviewed and stable  Complications: No apparent anesthesia complications

## 2013-09-22 NOTE — Plan of Care (Signed)
Problem: Phase I Progression Outcomes Goal: Pain controlled with appropriate interventions Outcome: Progressing Pt c/o pain 10/10 stating morphine wasn't working. Changed to dilaudid and pt says pain reduced to 5/10.  Goal: OOB as tolerated unless otherwise ordered Outcome: Progressing Pt ambulated to restroom DOS.  Goal: Voiding-avoid urinary catheter unless indicated Outcome: Completed/Met Date Met:  09/22/13 Pt voiding.

## 2013-09-22 NOTE — ED Notes (Signed)
Pt c/o right sided upper abdominal pain. Pt is scheduled to have gallbladder removed on Friday but states she is in a lot of pain and beginning to get nauseous.

## 2013-09-22 NOTE — Anesthesia Postprocedure Evaluation (Signed)
  Anesthesia Post-op Note  Patient: Nicole KelpValerie Haley  Procedure(s) Performed: Procedure(s) (LRB): LAPAROSCOPIC CHOLECYSTECTOMY WITH INTRAOPERATIVE CHOLANGIOGRAM (N/A)  Patient Location: PACU  Anesthesia Type: General  Level of Consciousness: awake and alert   Airway and Oxygen Therapy: Patient Spontanous Breathing  Post-op Pain: mild  Post-op Assessment: Post-op Vital signs reviewed, Patient's Cardiovascular Status Stable, Respiratory Function Stable, Patent Airway and No signs of Nausea or vomiting  Last Vitals:  Filed Vitals:   09/22/13 1830  BP: 135/65  Pulse:   Temp: 36.7 C  Resp: 16    Post-op Vital Signs: stable   Complications: No apparent anesthesia complications

## 2013-09-22 NOTE — Anesthesia Preprocedure Evaluation (Signed)
Anesthesia Evaluation  Patient identified by MRN, date of birth, ID band Patient awake    Reviewed: Allergy & Precautions, H&P , NPO status , Patient's Chart, lab work & pertinent test results  Airway Mallampati: II TM Distance: >3 FB Neck ROM: full    Dental no notable dental hx. (+) Dental Advisory Given and Teeth Intact   Pulmonary Current Smoker,  breath sounds clear to auscultation  Pulmonary exam normal       Cardiovascular Exercise Tolerance: Good negative cardio ROS  Rhythm:regular Rate:Normal     Neuro/Psych negative neurological ROS  negative psych ROS   GI/Hepatic negative GI ROS, Neg liver ROS, GERD-  Controlled,  Endo/Other  negative endocrine ROSMorbid obesity  Renal/GU negative Renal ROS  negative genitourinary   Musculoskeletal   Abdominal (+) + obese,   Peds  Hematology negative hematology ROS (+)   Anesthesia Other Findings   Reproductive/Obstetrics negative OB ROS                           Anesthesia Physical Anesthesia Plan  ASA: III  Anesthesia Plan: General   Post-op Pain Management:    Induction: Intravenous  Airway Management Planned: Oral ETT  Additional Equipment:   Intra-op Plan:   Post-operative Plan: Extubation in OR  Informed Consent: I have reviewed the patients History and Physical, chart, labs and discussed the procedure including the risks, benefits and alternatives for the proposed anesthesia with the patient or authorized representative who has indicated his/her understanding and acceptance.   Dental Advisory Given  Plan Discussed with: Surgeon  Anesthesia Plan Comments:         Anesthesia Quick Evaluation

## 2013-09-22 NOTE — Op Note (Addendum)
09/22/2013  5:38 PM  PATIENT:  Nicole Haley, 28 y.o., female, MRN: [id number]  PREOP DIAGNOSIS:  CHOLELITHIASIS  POSTOP DIAGNOSIS:   Acute edematous cholecystitis with impacted gall stone  PROCEDURE:   Procedure(s): LAPAROSCOPIC CHOLECYSTECTOMY WITH INTRAOPERATIVE CHOLANGIOGRAM  SURGEON:   Ovidio Kin, M.D.  ASSISTANTGordy Savers, M.D.   ANESTHESIA:   general  Anesthesiologist: Gaetano Hawthorne, MD CRNA: Epimenio Sarin, CRNA; Doran Clay, CRNA  General  ASA: @asa @  EBL:  minimal  ml  BLOOD ADMINISTERED: none  DRAINS: none   LOCAL MEDICATIONS USED:   30 cc 1/4 marcaine  SPECIMEN:   Gall bladder  COUNTS CORRECT:  YES  INDICATIONS FOR PROCEDURE:  Nicole Haley is a 28 y.o. (DOB: Apr 29, 1986) white  female whose primary care physician is Herb Grays, MD and comes for cholecystectomy.  I had originally seen her in the office on 09/11/2013 with her scheduled for surgery this Friday, 09/26/2013.  But she developed worsening abdominal pain, came to [facility] today because of the pain.  I discussed with her about going ahead today to do her sugery.   The indications and risks of the gall bladder surgery were explained to the patient.  The risks include, but are not limited to, infection, bleeding, common bile duct injury and open surgery.  SURGERY:  The patient was taken to room #1 at [facility].  The abdomen was prepped with chloroprep.  The patient was given 2 gm Ancef at the beginning of the operation.   A time out was held and the surgical checklist run.   An infraumbilical incision was made into the abdominal cavity.  A 12 mm Hasson trocar was inserted into the abdominal cavity through the infraumbilical incision and secured with a 0 Vicryl suture.  Three additional trocars were inserted: a 10 mm trocar in the sub-xiphoid location, a 5 mm trocar in the right mid subcostal area, and a 5 mm trocar in the right lateral subcostal area.   The abdomen was explored and the liver,  stomach, and bowel that could be seen were unremarkable.   The gall bladder was swollen and edematous.  I decompressed the gall bladder with a laparoscopic needle and aspirated "white bile".  I then grasped and rotated the gall bladder cephalad.  Disssection was carried down to the gall bladder/cystic duct junction and the cystic duct isolated.  A clip was placed on the gall bladder side of the cystic duct.   An intra-operative cholangiogram was shot.   The intra-operative cholangiogram was shot using a cut off Taut catheter placed through a 14 gauge angiocath in the RUQ.  The Taut catheter was inserted in the cut cystic duct and secured with an endoclip.  A cholangiogram was shot with 10 cc of 1/2 strength Omnipaque.  Using fluoroscopy, the cholangiogram showed the flow of contrast into the common bile duct, up the hepatic radicals, and into the duodenum.  There was no mass or obstruction.  This was a normal intra-operative cholangiogram.   The Taut catheter was removed.  The cystic duct was tripley endoclipped and the cystic artery was identified and clipped.  The gall bladder was bluntly and sharpley dissected from the gall bladder bed.   After the gall bladder was removed from the liver, the gall bladder bed and Triangle of [place]ot were inspected.  There was no bleeding or bile leak.  The gall bladder was placed in a endocatch bag and delivered through the umbilicus.  The abdomen was  irrigated with 1500 cc saline.   The trocars were then removed.  I infiltrated 30 cc of 1/4% Marcaine into the incisions.  The umbilical port closed with a 0 Vicryl suture and the skin closed with 5-0 vicryl.  The skin was painted with Dermabond.  The patient's sponge and needle count were correct.  The patient was transported to the RR in good condition.  Father's phone number - [phone] - I spoke to him on the phone.  Ovidio Kinavid Izen Petz, MD, [facility] Pager: [phone] Office phone:   [phone]

## 2013-09-23 ENCOUNTER — Telehealth (INDEPENDENT_AMBULATORY_CARE_PROVIDER_SITE_OTHER): Payer: Self-pay

## 2013-09-23 ENCOUNTER — Encounter (HOSPITAL_COMMUNITY): Payer: Self-pay | Admitting: Surgery

## 2013-09-23 MED ORDER — OXYCODONE-ACETAMINOPHEN 5-325 MG PO TABS: 1.0000 | ORAL_TABLET | ORAL | Status: DC | PRN

## 2013-09-23 MED ORDER — OXYCODONE-ACETAMINOPHEN 5-325 MG PO TABS
1.0000 | ORAL_TABLET | ORAL | Status: DC | PRN
  Administered 2013-09-23: 2 via ORAL
  Filled 2013-09-23: qty 2

## 2013-09-23 MED ORDER — DOCUSATE SODIUM 100 MG PO CAPS
100.0000 mg | ORAL_CAPSULE | Freq: Two times a day (BID) | ORAL | Status: DC
  Administered 2013-09-23: 100 mg via ORAL
  Filled 2013-09-23 (×2): qty 1

## 2013-09-23 NOTE — Progress Notes (Signed)
RN and SN, Dominica SeverinKiara Jacobs reviewed discharge instructions with patient and family member. All questions answered.  Handout and prescription given to patient.   RN encouraged patient to try and eat before leaving. Patient stated that PA informed her she could wait til she got home.  Patient wheeled down in wheelchair with nursing staff.

## 2013-09-23 NOTE — Discharge Summary (Signed)
  Physician Discharge Summary  Patient ID: Nicole Haley MRN: [id number] DOB/AGE: 28/11/1985 28 y.o.  Admit date: 09/22/2013 Discharge date: 09/23/2013  Admitting Diagnosis: Symptomatic cholelithiasis Obesity Acute cholecystitis with impacted gallstone  Discharge Diagnosis Patient Active Problem List   Diagnosis Date Noted  . Acute cholecystitis with chronic cholecystitis 09/22/2013  . Obesity (BMI 30-39.9) 09/22/2013  . Depression with anxiety 09/22/2013  . Cholelithiasis 09/22/2013  . Cholecystitis 09/22/2013  . GERD (gastroesophageal reflux disease)   . Gall bladder stones 09/11/2013    Consultants None  Imaging: Dg Cholangiogram Operative  09/22/2013   CLINICAL DATA:  Cholelithiasis.  Abdominal pain.  EXAM: INTRAOPERATIVE CHOLANGIOGRAM  TECHNIQUE: Cholangiographic images from the C-arm fluoroscopic device were submitted for interpretation post-operatively. Please see the procedural report for the amount of contrast and the fluoroscopy time utilized.  COMPARISON:  None.  FINDINGS: No evidence of dilatation or obstruction of the common bile duct. Prompt contrast emptying at into the duodenum is seen. No filling defects are seen within the common bile duct to suggest choledocholithiasis.  IMPRESSION: Negative. No evidence of common bile duct dilatation or obstruction.   Electronically Signed   By: Myles RosenthalJohn  Stahl M.D.   On: 09/22/2013 17:20    Procedures Dr. Ezzard StandingNewman (09/22/12) - [facility]  [facility] Course:  28 y/o white [facility] with RUQ/epigastric abdominal pain and nausea which has worsened over the last few days. C/o feeling feverish, chills, mid back pain, nausea, but no vomiting. She was pending surgery with Dr. Ezzard StandingNewman on Friday for lap chole for symptomatic cholelithiasis, but the pain has become so severe and constant that the percocet was not controlling her pain. Her pain was originally exacerbated with foods, but now is constant and  unrelated to foods. She was worried she may have passed a stone and came to the hospital to be further evaluated. She tried hot/cold packs without relief.   Patient was admitted and underwent procedure listed above.  Tolerated procedure well and was transferred to the floor.  Diet was advanced as tolerated.  On POD #1, the patient was voiding well, tolerating diet, ambulating well, pain well controlled, vital signs stable, incisions c/d/i and felt stable for discharge home.  Patient will follow up in our office in 2-3 weeks and knows to call with questions or concerns.  Physical Exam: General:  Alert, NAD, pleasant, comfortable Abd:  Soft, ND, mild tenderness, incisions C/D/I    Medication List    STOP taking these medications       naproxen sodium 220 MG tablet  Commonly known as:  ANAPROX     ondansetron 8 MG disintegrating tablet  Commonly known as:  ZOFRAN-ODT      TAKE these medications       oxyCODONE-acetaminophen 5-325 MG per tablet  Commonly known as:  PERCOCET/ROXICET  Take 1-2 tablets by mouth every 4 (four) hours as needed for severe pain.             Follow-up Information   [facility]. Schedule an appointment as soon as possible for a visit in 2 weeks.   Specialty:  General Surgery   Contact information:   [address] [place] [address] [phone]       Signed: Candiss NorseMegan Dort, PA-C [facility] [phone]  09/23/2013, 9:21 AM

## 2013-09-23 NOTE — Telephone Encounter (Signed)
Patient aware of appt with D.Newman 10-16-13 12n

## 2013-09-23 NOTE — Discharge Summary (Signed)
Recovered relatively well given significant cholecystitis.  Wishes to home.  Followup with Dr. Ezzard StandingNewman in the office soon

## 2013-09-23 NOTE — Discharge Instructions (Signed)
[facility] ______[facility] °LAPAROSCOPIC SURGERY: POST OP INSTRUCTIONS °Always review your discharge instruction sheet given to you by the facility where your surgery was performed. °IF YOU HAVE DISABILITY OR FAMILY LEAVE FORMS, YOU MUST BRING THEM TO THE OFFICE FOR PROCESSING.   °DO NOT GIVE THEM TO YOUR DOCTOR. ° °1. A prescription for pain medication may be given to you upon discharge.  Take your pain medication as prescribed, if needed.  If narcotic pain medicine is not needed, then you may take acetaminophen (Tylenol) or ibuprofen (Advil) as needed. °2. Take your usually prescribed medications unless otherwise directed. °3. If you need a refill on your pain medication, please contact your pharmacy.  They will contact our office to request authorization. Prescriptions will not be filled after 5pm or on week-ends. °4. You should follow a light diet the first few days after arrival home, such as soup and crackers, etc.  Be sure to include lots of fluids daily. °5. Most patients will experience some swelling and bruising in the area of the incisions.  Ice packs will help.  Swelling and bruising can take several days to resolve.  °6. It is common to experience some constipation if taking pain medication after surgery.  Increasing fluid intake and taking a stool softener (such as Colace) will usually help or prevent this problem from occurring.  A mild laxative (Milk of Magnesia or Miralax) should be taken according to package instructions if there are no bowel movements after 48 hours. °7. Unless discharge instructions indicate otherwise, you may remove your bandages 24-48 hours after surgery, and you may shower at that time.  You may have steri-strips (small skin tapes) in place directly over the incision.  These strips should be left on the skin for 7-10 days.  If your surgeon used skin glue on the incision, you may shower in 24 hours.  The glue will flake off over the next 2-3 weeks.  Any sutures or  staples will be removed at the office during your follow-up visit. °8. ACTIVITIES:  You may resume regular (light) daily activities beginning the next day--such as daily self-care, walking, climbing stairs--gradually increasing activities as tolerated.  You may have sexual intercourse when it is comfortable.  Refrain from any heavy lifting or straining until approved by your doctor. °a. You may drive when you are no longer taking prescription pain medication, you can comfortably wear a seatbelt, and you can safely maneuver your car and apply brakes. °b. RETURN TO WORK:  __________________________________________________________ °9. You should see your doctor in the office for a follow-up appointment approximately 2-3 weeks after your surgery.  Make sure that you call for this appointment within a day or two after you arrive home to insure a convenient appointment time. °10. OTHER INSTRUCTIONS: __________________________________________________________________________________________________________________________ __________________________________________________________________________________________________________________________ °WHEN TO CALL YOUR DOCTOR: °1. Fever over 101.0 °2. Inability to urinate °3. Continued bleeding from incision. °4. Increased pain, redness, or drainage from the incision. °5. Increasing abdominal pain ° °The clinic staff is available to answer your questions during regular business hours.  Please don’t hesitate to call and ask to speak to one of the nurses for clinical concerns.  If you have a medical emergency, go to the nearest emergency room or call 911.  A surgeon from [facility] is always on call at the hospital. °[address], [address], [place], [place]  [address] ? [address] [address], ,    [address] °[phone] ? [phone] ? FAX [phone] °Web site:   [email or web address] °

## 2013-09-23 NOTE — Care Management Note (Signed)
    Page 1 of 1   09/23/2013     10:51:08 AM   CARE MANAGEMENT NOTE 09/23/2013  Patient:  Nicole Haley,Nicole Haley   Account Number:  [id number]  Date Initiated:  09/23/2013  Documentation initiated by:  Lorenda IshiharaPEELE,Davey Bergsma  Subjective/Objective Assessment:   28 yo female admitted s/p lap chole. PTA lived at home with parents.     Action/Plan:   Home when stable   Anticipated DC Date:  09/23/2013   Anticipated DC Plan:  HOME/SELF CARE      DC Planning Services  CM consult      Choice offered to / List presented to:             Status of service:  Completed, signed off Medicare Important Message given?   (If response is "NO", the following Medicare IM given date fields will be blank) Date Medicare IM given:   Date Additional Medicare IM given:    Discharge Disposition:  HOME/SELF CARE  Per UR Regulation:  Reviewed for med. necessity/level of care/duration of stay  If discussed at Long Length of Stay Meetings, dates discussed:    Comments:

## 2013-09-26 ENCOUNTER — Ambulatory Visit (HOSPITAL_COMMUNITY): Admission: RE | Admit: 2013-09-26 | Payer: BC Managed Care – PPO | Source: Ambulatory Visit | Admitting: Surgery

## 2013-09-26 ENCOUNTER — Encounter (HOSPITAL_COMMUNITY): Admission: RE | Payer: Self-pay | Source: Ambulatory Visit

## 2013-09-26 SURGERY — LAPAROSCOPIC CHOLECYSTECTOMY WITH INTRAOPERATIVE CHOLANGIOGRAM
Anesthesia: General

## 2013-10-16 ENCOUNTER — Encounter (INDEPENDENT_AMBULATORY_CARE_PROVIDER_SITE_OTHER): Payer: BC Managed Care – PPO | Admitting: Surgery

## 2013-10-24 ENCOUNTER — Encounter (INDEPENDENT_AMBULATORY_CARE_PROVIDER_SITE_OTHER): Payer: BC Managed Care – PPO | Admitting: Surgery

## 2013-10-26 ENCOUNTER — Emergency Department (HOSPITAL_COMMUNITY)
Admission: EM | Admit: 2013-10-26 | Discharge: 2013-10-26 | Disposition: A | Discharge status: Home / Self Care | Payer: BC Managed Care – PPO | Attending: Emergency Medicine | Admitting: Emergency Medicine

## 2013-10-26 ENCOUNTER — Encounter (HOSPITAL_COMMUNITY): Payer: Self-pay | Admitting: Emergency Medicine

## 2013-10-26 DIAGNOSIS — Y836 Removal of other organ (partial) (total) as the cause of abnormal reaction of the patient, or of later complication, without mention of misadventure at the time of the procedure: Secondary | ICD-10-CM | POA: Insufficient documentation

## 2013-10-26 DIAGNOSIS — L0390 Cellulitis, unspecified: Secondary | ICD-10-CM

## 2013-10-26 DIAGNOSIS — L03319 Cellulitis of trunk, unspecified: Secondary | ICD-10-CM

## 2013-10-26 DIAGNOSIS — Z862 Personal history of diseases of the blood and blood-forming organs and certain disorders involving the immune mechanism: Secondary | ICD-10-CM | POA: Insufficient documentation

## 2013-10-26 DIAGNOSIS — L02219 Cutaneous abscess of trunk, unspecified: Secondary | ICD-10-CM | POA: Insufficient documentation

## 2013-10-26 DIAGNOSIS — Z8719 Personal history of other diseases of the digestive system: Secondary | ICD-10-CM | POA: Insufficient documentation

## 2013-10-26 DIAGNOSIS — T8140XA Infection following a procedure, unspecified, initial encounter: Secondary | ICD-10-CM | POA: Insufficient documentation

## 2013-10-26 DIAGNOSIS — F17200 Nicotine dependence, unspecified, uncomplicated: Secondary | ICD-10-CM | POA: Insufficient documentation

## 2013-10-26 DIAGNOSIS — Z8739 Personal history of other diseases of the musculoskeletal system and connective tissue: Secondary | ICD-10-CM | POA: Insufficient documentation

## 2013-10-26 MED ORDER — HYDROCODONE-ACETAMINOPHEN 5-325 MG PO TABS: 1.0000 | ORAL_TABLET | Freq: Four times a day (QID) | ORAL | Status: DC | PRN

## 2013-10-26 MED ORDER — CEPHALEXIN 500 MG PO CAPS: 500.0000 mg | ORAL_CAPSULE | Freq: Four times a day (QID) | ORAL | Status: DC

## 2013-10-26 NOTE — ED Notes (Signed)
Pt has gall bladder removed on 2/6 laparascopically.  She had a scheduled follow-up appt with Dr. Ezzard StandingNewman (who did her surgery), but it was rescheduled for this coming Thursday due to the weather over last couple weeks.  Pt states yesterday she began having pain around the umbilical incision.  There is some mild reddening around incision.  No swelling or drainage noted or reported.  Pt denies fever, nausea and vomiting.  Pt rates pain around area 9/10 when she tries to bend over, but 4/10 if she is not moving or bending.

## 2013-10-26 NOTE — Discharge Instructions (Signed)
Cellulitis Cellulitis is an infection of the skin and the tissue beneath it. The infected area is usually red and tender. Cellulitis occurs most often in the arms and lower legs.  CAUSES  Cellulitis is caused by bacteria that enter the skin through cracks or cuts in the skin. The most common types of bacteria that cause cellulitis are Staphylococcus and Streptococcus. SYMPTOMS   Redness and warmth.  Swelling.  Tenderness or pain.  Fever. DIAGNOSIS  Your caregiver can usually determine what is wrong based on a physical exam. Blood tests may also be done. TREATMENT  Treatment usually involves taking an antibiotic medicine. HOME CARE INSTRUCTIONS   Take your antibiotics as directed. Finish them even if you start to feel better.  Keep the infected arm or leg elevated to reduce swelling.  Apply a warm cloth to the affected area up to 4 times per day to relieve pain.  Only take over-the-counter or prescription medicines for pain, discomfort, or fever as directed by your caregiver.  Keep all follow-up appointments as directed by your caregiver. SEEK MEDICAL CARE IF:   You notice red streaks coming from the infected area.  Your red area gets larger or turns dark in color.  Your bone or joint underneath the infected area becomes painful after the skin has healed.  Your infection returns in the same area or another area.  You notice a swollen bump in the infected area.  You develop new symptoms. SEEK IMMEDIATE MEDICAL CARE IF:   You have a fever.  You feel very sleepy.  You develop vomiting or diarrhea.  You have a general ill feeling (malaise) with muscle aches and pains. MAKE SURE YOU:   Understand these instructions.  Will watch your condition.  Will get help right away if you are not doing well or get worse. Document Released: 05/17/2005 Document Revised: 02/06/2012 Document Reviewed: 10/23/2011 Exit[employer] Patient Information 2014 [employer].  

## 2013-10-26 NOTE — ED Notes (Signed)
Pt spoke to the doctor on call for [facility] and he recommended she come to ED if she didn't want to wait until tomorrow to be seen at their office.

## 2013-10-26 NOTE — ED Provider Notes (Addendum)
CSN: [id number]     Arrival date & time 10/26/13  1333 History   First MD Initiated Contact with Patient 10/26/13 1354     Chief Complaint  Patient presents with  . Post-op Problem    pain around surgical site     (Consider location/radiation/quality/duration/timing/severity/associated sxs/prior Treatment) HPI Comments: Pt presents s/p chole on 09/26/13 with 24 hours of skin pain, redness over the umbilical surgical site.  She states her jeans have been rubbing there occasionally but denies drainage or swelling.  Normal diet and eating and drinking regularly with normal bm's.  Denies fever or systemic sx.  Denies hx of MRSA or DM.  The history is provided by the patient.    Past Medical History  Diagnosis Date  . Headache(784.0)   . Gallstones   . GERD (gastroesophageal reflux disease)   . Anemia     low iron? never been able to give blood, but not diagnosed  . Sciatica    Past Surgical History  Procedure Laterality Date  . Dilation and curettage of uterus  2009    spontaneous abortion  . Wisdom tooth extraction  2006    x 4  . Dilation and curettage of uterus  2006  . Cholecystectomy N/A 09/22/2013    Procedure: LAPAROSCOPIC CHOLECYSTECTOMY WITH INTRAOPERATIVE CHOLANGIOGRAM;  Surgeon: Kandis Cocking, MD;  Location: [facility];  Service: General;  Laterality: N/A;   Family History  Problem Relation Age of Onset  . Diabetes Mother   . Hypertension Mother   . Hypertension Father    History  Substance Use Topics  . Smoking status: Current Every Day Smoker -- 0.50 packs/day for 15 years    Types: Cigarettes  . Smokeless tobacco: Never Used  . Alcohol Use: Yes     Comment: twice a week 1-2 glasses of wine   OB History   Grav Para Term Preterm Abortions TAB SAB Ect Mult Living                 Review of Systems  All other systems reviewed and are negative.      Allergies  Review of patient's allergies indicates no known allergies.  Home Medications   Current  Outpatient Rx  Name  Route  Sig  Dispense  Refill  . oxyCODONE-acetaminophen (PERCOCET/ROXICET) 5-325 MG per tablet   Oral   Take 1-2 tablets by mouth every 4 (four) hours as needed for severe pain.   40 tablet   0    BP 128/68  Pulse 92  Temp(Src) 98.7 F (37.1 C) (Oral)  Resp 14  SpO2 97%  LMP 10/24/2013 Physical Exam  Nursing note and vitals reviewed. Constitutional: She is oriented to person, place, and time. She appears well-developed and well-nourished. No distress.  HENT:  Head: Normocephalic and atraumatic.  Cardiovascular: Normal rate.   Pulmonary/Chest: Effort normal.  Abdominal:    No abd tenderness other than superficial tenderness at the umbilicus.  Musculoskeletal: Normal range of motion.  Neurological: She is alert and oriented to person, place, and time.  Skin: Skin is warm and dry. There is erythema.  Psychiatric: She has a normal mood and affect. Her behavior is normal.    ED Course  Procedures (including critical care time) Labs Review Labs Reviewed - No data to display Imaging Review No results found.   EKG Interpretation None      MDM   Final diagnoses:  Cellulitis    Patient presents status post cholecystectomy on 09/26/2013 with 24 hours  of pain over her umbilical scar with mild redness without drainage. On exam patient appears to have a small superficial cellulitis most likely a stitch infection. There is no drainage, fluctuance or concern for abscess. She otherwise has no systemic symptoms and has been feeling well eating and drinking regularly and having normal bowel movements. Patient has no history of MRSA will treat with Keflex and have her followup appointment with Dr. Ezzard StandingNewman on Thursday.   She knows to call sooner if area has worsening redness and drainage.  Area marked.    Gwyneth SproutWhitney Nai Borromeo, MD 10/26/13 1410  Gwyneth SproutWhitney Landrie Beale, MD 10/26/13 [phone]

## 2013-10-29 ENCOUNTER — Telehealth (INDEPENDENT_AMBULATORY_CARE_PROVIDER_SITE_OTHER): Payer: Self-pay | Admitting: *Deleted

## 2013-10-29 NOTE — Telephone Encounter (Signed)
Received call from pt, she was at ED 10/26/13 regarding redness and pain at incision.  Per ED, possible infected stitch? Pt was placed on antibiotic by ED physician.  Pt asking to come in earlier to see Dr. Ezzard StandingNewman.   I spoke with Dr. Ezzard StandingNewman, showed him ED note and he recommended for her to continue the treatment from ED and f/u with him at her original apt 10/30/13..  Pt agrees and is understanding..  JKW

## 2013-10-30 ENCOUNTER — Ambulatory Visit (INDEPENDENT_AMBULATORY_CARE_PROVIDER_SITE_OTHER): Payer: BC Managed Care – PPO | Admitting: Surgery

## 2013-10-30 ENCOUNTER — Encounter (INDEPENDENT_AMBULATORY_CARE_PROVIDER_SITE_OTHER): Payer: Self-pay | Admitting: Surgery

## 2013-10-30 VITALS — BP 124/74 | HR 77 | Temp 97.8°F | Resp 16 | Ht 66.0 in | Wt 230.0 lb

## 2013-10-30 DIAGNOSIS — K812 Acute cholecystitis with chronic cholecystitis: Secondary | ICD-10-CM

## 2013-10-30 DIAGNOSIS — K8020 Calculus of gallbladder without cholecystitis without obstruction: Secondary | ICD-10-CM

## 2013-10-30 NOTE — Progress Notes (Signed)
Re:   Nicole Haley DOB:   11/23/1985 MRN:   [id number]  ASSESSMENT AND PLAN: 1.  Lap chole with IOC - 09/23/2013 - D. Rinaldo RatelNewman  Gall bladder disease and cholelithiais  I gave her #20 of Vicodin (5/325)  Her return appt is PRN.  Unless her umbilical incision causes more problems   1B. Seen in ER on 10/26/2013 for umbilical suture inflammation.   Started on Keflex.  Better now. 2.  Smokes, trying to quit 3.  Obese - BMI 38.1.  Chief Complaint  Patient presents with  . Routine Post Op   REFERRING PHYSICIAN: Herb GraysSPEAR, TAMMY, MD  HISTORY OF PRESENT ILLNESS: Nicole Haley is a 28 y.o. (DOB: 12/06/1985)  white  female whose primary care physician is SPEAR, TAMMY, MD and comes to me today for gall bladder disease. Came be self. Doing well except for inflammation at the umbilicus. She went to the ER on Sunday, 10/27/2103, was given Keflex, and is doing better now.  History of gall bladder disease (08/2013): Ms. Nicole Haley has had some vague right back pain on and off for about a year, but she has some chronic back discomfort, so she did not think much about it.  Then in December 2014, she had severe RUQ and right back pain and [facility]  She has no family history of gall stones.  And she has had no prior abdominal surgery or GI symptoms.  She does notice that this pain comes with eating fatty foods.  [facility] with abdominal pain and saw Dr. Haywood LassoK. Campos.  US showed a large gall stone and was instructed to follow up with surgery. Then she represented 09/07/2013 with recurrent abdominal pain.  She is now here for evaluation.  Abdominal US - 07/29/2013 - 1. Large gallstone within the gallbladder neck, potentially impacted. No specific signs of cholecystitis identified.  2. No biliary dilatation.  LFT's normal - 09/07/2013  Past Medical History  Diagnosis Date  . Headache(784.0)   . Gallstones   . GERD (gastroesophageal reflux disease)   . Anemia     low iron? never been  able to give blood, but not diagnosed  . Sciatica     Past Surgical History  Procedure Laterality Date  . Dilation and curettage of uterus  2009    spontaneous abortion  . Wisdom tooth extraction  2006    x 4  . Dilation and curettage of uterus  2006  . Cholecystectomy N/A 09/22/2013    Procedure: LAPAROSCOPIC CHOLECYSTECTOMY WITH INTRAOPERATIVE CHOLANGIOGRAM;  Surgeon: Kandis Cockingavid H Jac Romulus, MD;  Location: [facility];  Service: General;  Laterality: N/A;    Current Outpatient Prescriptions  Medication Sig Dispense Refill  . cephALEXin (KEFLEX) 500 MG capsule Take 1 capsule (500 mg total) by mouth 4 (four) times daily.  20 capsule  0  . HYDROcodone-acetaminophen (NORCO/VICODIN) 5-325 MG per tablet Take 1-2 tablets by mouth every 6 (six) hours as needed for moderate pain.  6 tablet  0  . naproxen sodium (ANAPROX) 220 MG tablet Take 440 mg by mouth 2 (two) times daily as needed (pain).       No current facility-administered medications for this visit.     No Known Allergies  REVIEW OF SYSTEMS: Neurologic:  Has occasional headaches, but has not sought medical care. Pulmonary:  Smokes about 1/2 ppd  SOCIAL and FAMILY HISTORY: Divorced. 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  Tattoos right wrist and right hip. HEENT: Normal. Pupils equal. Abdomen: Soft. No mass. Tenderness and mild redness at umbilicus.  I tried to remove suture knot, but could not.  I expect this to continue to improve.  I told her she could soak her incisions under water.  DATA REVIEWED: Path to patient.  Ovidio Kin, MD,  [facility] [address] [place]  [address]   [place], [place]    [address] Phone:   [phone] FAX:  [phone]

## 2015-05-24 ENCOUNTER — Emergency Department (INDEPENDENT_AMBULATORY_CARE_PROVIDER_SITE_OTHER)
Admission: EM | Admit: 2015-05-24 | Discharge: 2015-05-24 | Disposition: A | Discharge status: Home / Self Care | Payer: No Typology Code available for payment source | Source: Home / Self Care

## 2015-05-24 ENCOUNTER — Encounter (HOSPITAL_COMMUNITY): Payer: Self-pay | Admitting: Emergency Medicine

## 2015-05-24 DIAGNOSIS — H6502 Acute serous otitis media, left ear: Secondary | ICD-10-CM | POA: Diagnosis not present

## 2015-05-24 MED ORDER — KETOROLAC TROMETHAMINE 60 MG/2ML IM SOLN
60.0000 mg | Freq: Once | INTRAMUSCULAR | Status: AC
  Administered 2015-05-24: 60 mg via INTRAMUSCULAR

## 2015-05-24 MED ORDER — AMOXICILLIN 875 MG PO TABS: 875.0000 mg | ORAL_TABLET | Freq: Two times a day (BID) | ORAL | Status: DC

## 2015-05-24 MED ORDER — KETOROLAC TROMETHAMINE 60 MG/2ML IM SOLN
INTRAMUSCULAR | Status: AC
  Filled 2015-05-24: qty 2

## 2015-05-24 MED ORDER — FLUCONAZOLE 200 MG PO TABS: 200.0000 mg | ORAL_TABLET | Freq: Every day | ORAL | Status: AC

## 2015-05-24 NOTE — ED Notes (Signed)
Pt reports sneezing and nasal congestion yesterday.  She woke up this morning with some pain in her left ear, but by this afternoon, the pain became much worse.

## 2015-05-24 NOTE — ED Provider Notes (Signed)
CSN: [id number]     Arrival date & time 05/24/15  1346 History   None    Chief Complaint  Patient presents with  . Otalgia    left   (Consider location/radiation/quality/duration/timing/severity/associated sxs/prior Treatment) Patient is a 29 y.o. female presenting with ear pain. The history is provided by the patient.  Otalgia Location:  Left Quality:  Aching Severity:  Moderate Onset quality:  Gradual Duration:  1 day Timing:  Constant Progression:  Worsening   Past Medical History  Diagnosis Date  . Headache(784.0)   . Gallstones   . GERD (gastroesophageal reflux disease)   . Anemia     low iron? never been able to give blood, but not diagnosed  . Sciatica    Past Surgical History  Procedure Laterality Date  . Dilation and curettage of uterus  2009    spontaneous abortion  . Wisdom tooth extraction  2006    x 4  . Dilation and curettage of uterus  2006  . Cholecystectomy N/A 09/22/2013    Procedure: LAPAROSCOPIC CHOLECYSTECTOMY WITH INTRAOPERATIVE CHOLANGIOGRAM;  Surgeon: Kandis Cocking, MD;  Location: [facility];  Service: General;  Laterality: N/A;   Family History  Problem Relation Age of Onset  . Diabetes Mother   . Hypertension Mother   . Hypertension Father    Social History  Substance Use Topics  . Smoking status: Former Smoker -- 0.50 packs/day for 15 years    Types: Cigarettes  . Smokeless tobacco: Never Used  . Alcohol Use: Yes     Comment: twice a week 1-2 glasses of wine   OB History    No data available     Review of Systems  Constitutional: Negative.   HENT: Positive for ear pain.   Eyes: Negative.   Respiratory: Negative.   Cardiovascular: Negative.   Gastrointestinal: Negative.   Endocrine: Negative.   Genitourinary: Negative.   Allergic/Immunologic: Negative.   Neurological: Negative.   Hematological: Negative.   Psychiatric/Behavioral: Negative.     Allergies  Review of patient's allergies indicates no known allergies.  Home  Medications   Prior to Admission medications   Medication Sig Start Date End Date Taking? Authorizing Provider  cephALEXin (KEFLEX) 500 MG capsule Take 1 capsule (500 mg total) by mouth 4 (four) times daily. 10/26/13   Gwyneth Sprout, MD  HYDROcodone-acetaminophen (NORCO/VICODIN) 5-325 MG per tablet Take 1-2 tablets by mouth every 6 (six) hours as needed for moderate pain. 10/26/13   Gwyneth Sprout, MD  naproxen sodium (ANAPROX) 220 MG tablet Take 440 mg by mouth 2 (two) times daily as needed (pain).    Historical Provider, MD   Meds Ordered and Administered this Visit  Medications - No data to display  BP 102/69 mmHg  Pulse 71  Temp(Src) 98.8 F (37.1 C) (Oral)  Resp 16  SpO2 100%  LMP 05/02/2015 (Exact Date) No data found.   Physical Exam  Constitutional: She is oriented to person, place, and time. She appears well-developed and well-nourished.  HENT:  Head: Normocephalic and atraumatic.  Right Ear: External ear normal.  Left TM dull and injected.  Cardiovascular: Normal rate.   Pulmonary/Chest: Effort normal.  Abdominal: Soft. Bowel sounds are normal.  Musculoskeletal: Normal range of motion.  Neurological: She is alert and oriented to person, place, and time.    ED Course  Procedures (including critical care time)  Labs Review Labs Reviewed - No data to display  Imaging Review No results found.   Visual Acuity Review  Right  Eye Distance:   Left Eye Distance:   Bilateral Distance:    Right Eye Near:   Left Eye Near:    Bilateral Near:         MDM      Deatra Canter, FNP 05/24/15 1727

## 2016-07-11 ENCOUNTER — Ambulatory Visit (INDEPENDENT_AMBULATORY_CARE_PROVIDER_SITE_OTHER): Payer: Self-pay | Admitting: *Deleted

## 2016-07-11 ENCOUNTER — Encounter: Payer: Self-pay | Admitting: Family Medicine

## 2016-07-11 ENCOUNTER — Inpatient Hospital Stay (HOSPITAL_COMMUNITY)
Admission: AD | Admit: 2016-07-11 | Discharge: 2016-07-11 | Discharge status: Absent without leave | Payer: No Typology Code available for payment source | Attending: Obstetrics & Gynecology | Admitting: Obstetrics & Gynecology

## 2016-07-11 DIAGNOSIS — Z3200 Encounter for pregnancy test, result unknown: Secondary | ICD-10-CM

## 2016-07-11 DIAGNOSIS — Z3201 Encounter for pregnancy test, result positive: Secondary | ICD-10-CM

## 2016-07-11 LAB — POCT PREGNANCY, URINE: PREG TEST UR: POSITIVE — AB

## 2016-07-11 NOTE — Progress Notes (Signed)
LMP - unsure =  05/27/16.   EDD 03/03/17

## 2016-08-10 ENCOUNTER — Ambulatory Visit (INDEPENDENT_AMBULATORY_CARE_PROVIDER_SITE_OTHER): Payer: Medicaid Other | Admitting: Clinical

## 2016-08-10 ENCOUNTER — Ambulatory Visit (INDEPENDENT_AMBULATORY_CARE_PROVIDER_SITE_OTHER): Payer: Medicaid Other | Admitting: Obstetrics & Gynecology

## 2016-08-10 ENCOUNTER — Encounter: Payer: Self-pay | Admitting: Obstetrics & Gynecology

## 2016-08-10 VITALS — BP 117/79 | HR 89 | Wt 201.7 lb

## 2016-08-10 DIAGNOSIS — Z1151 Encounter for screening for human papillomavirus (HPV): Secondary | ICD-10-CM | POA: Diagnosis not present

## 2016-08-10 DIAGNOSIS — O09219 Supervision of pregnancy with history of pre-term labor, unspecified trimester: Secondary | ICD-10-CM

## 2016-08-10 DIAGNOSIS — Z23 Encounter for immunization: Secondary | ICD-10-CM | POA: Diagnosis not present

## 2016-08-10 DIAGNOSIS — Z113 Encounter for screening for infections with a predominantly sexual mode of transmission: Secondary | ICD-10-CM | POA: Diagnosis not present

## 2016-08-10 DIAGNOSIS — Z124 Encounter for screening for malignant neoplasm of cervix: Secondary | ICD-10-CM | POA: Diagnosis not present

## 2016-08-10 DIAGNOSIS — Z8751 Personal history of pre-term labor: Secondary | ICD-10-CM | POA: Insufficient documentation

## 2016-08-10 DIAGNOSIS — O09211 Supervision of pregnancy with history of pre-term labor, first trimester: Secondary | ICD-10-CM

## 2016-08-10 DIAGNOSIS — Z3401 Encounter for supervision of normal first pregnancy, first trimester: Secondary | ICD-10-CM

## 2016-08-10 DIAGNOSIS — Z9049 Acquired absence of other specified parts of digestive tract: Secondary | ICD-10-CM | POA: Diagnosis not present

## 2016-08-10 DIAGNOSIS — F4323 Adjustment disorder with mixed anxiety and depressed mood: Secondary | ICD-10-CM

## 2016-08-10 DIAGNOSIS — O0990 Supervision of high risk pregnancy, unspecified, unspecified trimester: Secondary | ICD-10-CM

## 2016-08-10 DIAGNOSIS — O09899 Supervision of other high risk pregnancies, unspecified trimester: Secondary | ICD-10-CM

## 2016-08-10 HISTORY — DX: Acquired absence of other specified parts of digestive tract: Z90.49

## 2016-08-10 LAB — POCT URINALYSIS DIP (DEVICE)
BILIRUBIN URINE: NEGATIVE
Glucose, UA: NEGATIVE mg/dL
KETONES UR: NEGATIVE mg/dL
Leukocytes, UA: NEGATIVE
NITRITE: NEGATIVE
PH: 8 (ref 5.0–8.0)
Protein, ur: NEGATIVE mg/dL
Specific Gravity, Urine: 1.015 (ref 1.005–1.030)
Urobilinogen, UA: 0.2 mg/dL (ref 0.0–1.0)

## 2016-08-10 LAB — TSH: TSH: 1 m[IU]/L

## 2016-08-10 NOTE — BH Specialist Note (Signed)
Session Start time: 10:25   End Time: 10:45 Total Time:  20 minutes Type of Service: Behavioral Health - Individual/Family Interpreter: No.   Interpreter Name & LanguageGretta Cool: n/a [facility]   SUBJECTIVE: Nicole KelpValerie Haley is a 30 y.o. female brought in by Self.  Pt./Family was referred by Dr. Penne LashLeggett for:  anxiety and depression. Pt./Family reports the following symptoms/concerns: Pt states that she has been dealing with depression for years, and has found many coping methods through the years; most concerning today is feeling worry over current pregnancy, as last ended in miscarriage. Pt is open to additional self-care strategies and self-education; not interested in medication.   Duration of problem:  Increase in past two months(current pregnancy) Severity: moderately severe(depressive), moderate(anxiety) Previous treatment: Previously treated with BH meds for depression and panic attacks  OBJECTIVE: Mood: Appropriate & Affect: Appropriate Risk of harm to self or others: No known risk of harm to self or others today; no SI, no HI Assessments administered: PHQ9: 17/ GAD7: 11  LIFE CONTEXT:  Family & Social: Lives with husband and  9yo daughter School/ Work: Unknown  Self-Care: Hot baths for stress, some sleeping/eating issues related to early pregnancy  Life changes: Current pregnancy What is important to pt/family (values): Overall wellbeing, family   GOALS ADDRESSED:  -Reduce symptoms of anxiety and depression  INTERVENTIONS: Solution Focused   ASSESSMENT:  Pt/Family currently experiencing Adjustment disorder with mixed anxious and depressed mood.  Pt/Family may benefit from psychoeducation and brief therapeutic intervention regarding coping with symptoms of anxiety and depression.      PLAN: 1. F/U with behavioral health clinician: One month 2. Behavioral recommendations:  -Read educational material regarding coping with symptoms of anxiety(with panic  attacks) and depression -Try out relaxation apps, as discussed in office visit, for additional self-care 3. Referral: Brief Counseling/Psychotherapy and Psychoeducation 4. From scale of 1-10, how likely are you to follow plan: 9   Woc-Behavioral Health Clinician  Behavioral Health Clinician  Marlon PelWarmhandoff:   Warm Hand Off Completed.       Depression screen PHQ 2/9 08/10/2016  Decreased Interest 2  Down, Depressed, Hopeless 3  PHQ - 2 Score 5  Altered sleeping 2  Tired, decreased energy 3  Change in appetite 2  Feeling bad or failure about yourself  3  Trouble concentrating 2  Moving slowly or fidgety/restless 0  Suicidal thoughts 0  PHQ-9 Score 17   GAD 7 : Generalized Anxiety Score 08/10/2016  Nervous, Anxious, on Edge 2  Control/stop worrying 2  Worry too much - different things 2  Trouble relaxing 2  Restless 1  Easily annoyed or irritable 1  Afraid - awful might happen 1  Total GAD 7 Score 11

## 2016-08-10 NOTE — Progress Notes (Signed)
New ob packet given  First Trimester Screen scheduled for 08/25/16 @ 0830.  Pt notified.    Subjective:    Nicole Haley is a [id number] 3648w5d being seen today for her first obstetrical visit.  Her obstetrical history is significant for history of preterm labor. Patient does intend to breast feed. Pregnancy history fully reviewed.  Patient reports no complaints.  Vitals:   08/10/16 0935  BP: 117/79  Pulse: 89  Weight: 201 lb 11.2 oz (91.5 kg)    HISTORY: OB History  Gravida Para Term Preterm AB Living  8 3   3 4 1   SAB TAB Ectopic Multiple Live Births  1 3     1     # Outcome Date GA Lbr Len/2nd Weight Sex Delivery Anes PTL Lv  8 Current           7 TAB 2010          6 Preterm 2009 3065w0d   F Vag-Spont     5 Preterm 03/07/07 5872w0d  6 lb 14 oz (3.118 kg) M Vag-Spont EPI Y LIV  4 SAB 2006          3 Preterm           2 TAB           1 TAB              Past Medical History:  Diagnosis Date  . Anemia    low iron? never been able to give blood, but not diagnosed  . Gallstones   . GERD (gastroesophageal reflux disease)   . Headache(784.0)   . Sciatica    Past Surgical History:  Procedure Laterality Date  . CHOLECYSTECTOMY N/A 09/22/2013   Procedure: LAPAROSCOPIC CHOLECYSTECTOMY WITH INTRAOPERATIVE CHOLANGIOGRAM;  Surgeon: Kandis Cockingavid H Newman, MD;  Location: [facility];  Service: General;  Laterality: N/A;  . DILATION AND CURETTAGE OF UTERUS  2009   spontaneous abortion  . DILATION AND CURETTAGE OF UTERUS  2006  . WISDOM TOOTH EXTRACTION  2006   x 4   Family History  Problem Relation Age of Onset  . Diabetes Mother   . Hypertension Mother   . Hypertension Father      Exam    Uterus:     Pelvic Exam:    Perineum: No Hemorrhoids   Vulva: normal   Vagina:  normal mucosa, normal discharge   pH: N/A   Cervix: no lesions   Adnexa: normal adnexa   Bony Pelvis: average  System: Breast:  normal appearance, no masses or tenderness   Skin: normal coloration and turgor, no  rashes    Neurologic: oriented, normal mood   Extremities: normal strength, tone, and muscle mass   HEENT sclera clear, anicteric, oropharynx clear, no lesions, neck supple with midline trachea, thyroid without masses and trachea midline   Mouth/Teeth mucous membranes moist, pharynx normal without lesions and dental hygiene good   Neck supple and no masses   Cardiovascular: regular rate and rhythm   Respiratory:  appears well, vitals normal, no respiratory distress, acyanotic, normal RR, chest clear, no wheezing, crepitations, rhonchi, normal symmetric air entry   Abdomen: soft, non-tender; bowel sounds normal; no masses,  no organomegaly   Urinary: urethral meatus normal      Assessment:    Pregnancy: [id number] Patient Active Problem List   Diagnosis Date Noted  . Supervision of low-risk first pregnancy, first trimester 08/10/2016  . History of preterm delivery, currently pregnant 08/10/2016  . Acute cholecystitis  with chronic cholecystitis 09/22/2013  . Obesity (BMI 30-39.9) 09/22/2013  . Depression with anxiety 09/22/2013  . Cholelithiasis 09/22/2013  . Cholecystitis 09/22/2013  . GERD (gastroesophageal reflux disease)   . Gall bladder stones 09/11/2013        Plan:     Initial labs drawn. Prenatal vitamins. Problem list reviewed and updated. Genetic Screening discussed First Screen: ordered. Ultrasound discussed; fetal survey: requested.  1.  History of preterm labor--Patient had a 35 week delivery. She had emesis no prenatal care during this delivery. The baby was MS 6-3/4 pounds. I suspect the patient either had bad dates or diabetes. We're doing a first trimester early screening. Patient also had a 20 week preterm delivery. It sounds that she labored at home. She said she held the baby N until she got the hospital and then she pushed it out. It was not living when she delivered. It is unsure timing of death. We are requesting records for this delivery.  2.  Overweight  and age greater than 30. Early glucose screening, TSH.  3.  Depression. Me with Nicole Haley. Medications when necessary.  4.  History of THC in pregnancy. Patient has now stopped.  5.  Flu shot today  Follow up in 4 weeks.   Nicole LincolnKelly Macenzie Haley 08/10/2016

## 2016-08-11 LAB — PRENATAL PROFILE (SOLSTAS)
Antibody Screen: NEGATIVE
BASOS ABS: 0 {cells}/uL (ref 0–200)
Basophils Relative: 0 %
EOS ABS: 97 {cells}/uL (ref 15–500)
Eosinophils Relative: 1 %
HCT: 39.2 % (ref 35.0–45.0)
HEMOGLOBIN: 13.3 g/dL (ref 11.7–15.5)
HEP B S AG: NEGATIVE
HIV 1&2 Ab, 4th Generation: NONREACTIVE
LYMPHS ABS: 2425 {cells}/uL (ref 850–3900)
Lymphocytes Relative: 25 %
MCH: 32.1 pg (ref 27.0–33.0)
MCHC: 33.9 g/dL (ref 32.0–36.0)
MCV: 94.7 fL (ref 80.0–100.0)
MPV: 9.5 fL (ref 7.5–12.5)
Monocytes Absolute: 485 cells/uL (ref 200–950)
Monocytes Relative: 5 %
NEUTROS ABS: 6693 {cells}/uL (ref 1500–7800)
Neutrophils Relative %: 69 %
PLATELETS: 212 10*3/uL (ref 140–400)
RBC: 4.14 MIL/uL (ref 3.80–5.10)
RDW: 12.7 % (ref 11.0–15.0)
RUBELLA: 1.77 {index} — AB (ref <0.90)
Rh Type: NEGATIVE
WBC: 9.7 10*3/uL (ref 3.8–10.8)

## 2016-08-11 LAB — GC/CHLAMYDIA PROBE AMP (~~LOC~~) NOT AT ARMC
CHLAMYDIA, DNA PROBE: NEGATIVE
NEISSERIA GONORRHEA: NEGATIVE

## 2016-08-11 LAB — CULTURE, OB URINE: Organism ID, Bacteria: NO GROWTH

## 2016-08-12 ENCOUNTER — Encounter: Payer: Self-pay | Admitting: Obstetrics & Gynecology

## 2016-08-12 DIAGNOSIS — O26899 Other specified pregnancy related conditions, unspecified trimester: Secondary | ICD-10-CM

## 2016-08-12 DIAGNOSIS — Z6791 Unspecified blood type, Rh negative: Secondary | ICD-10-CM | POA: Insufficient documentation

## 2016-08-13 LAB — PAIN MGMT, PROFILE 6 CONF W/O MM, U
6 ACETYLMORPHINE: NEGATIVE ng/mL (ref <10)
AMPHETAMINES: NEGATIVE ng/mL (ref <500)
Alcohol Metabolites: NEGATIVE ng/mL (ref <500)
Barbiturates: NEGATIVE ng/mL (ref <300)
Benzodiazepines: NEGATIVE ng/mL (ref <100)
Cocaine Metabolite: NEGATIVE ng/mL (ref <150)
Creatinine: 43.3 mg/dL (ref >=20.0)
Marijuana Metabolite: 443 ng/mL — ABNORMAL HIGH (ref <5)
Marijuana Metabolite: POSITIVE ng/mL — AB (ref <20)
Methadone Metabolite: NEGATIVE ng/mL (ref <100)
OXIDANT: NEGATIVE ug/mL (ref <200)
Opiates: NEGATIVE ng/mL (ref <100)
Oxycodone: NEGATIVE ng/mL (ref <100)
PLEASE NOTE: 0
Phencyclidine: NEGATIVE ng/mL (ref <25)
pH: 7.37 (ref 4.5–9.0)

## 2016-08-15 LAB — CYTOLOGY - PAP
DIAGNOSIS: NEGATIVE
HPV 16/18/45 genotyping: POSITIVE — AB
HPV: DETECTED — AB

## 2016-08-21 NOTE — L&D Delivery Note (Signed)
31 y.o. [id number] at 4075w5d admitted for SOL delivered a viable female infant at 0932 in cephalic, ROA position. Nuchal cord easily reduced after delivering through. Left anterior shoulder delivered with ease. 60 sec delayed cord clamping. Cord clamped x2 and cut. Placenta delivered spontaneously intact, with 3VC. Fundus firm on exam with massage and pitocin. Good hemostasis noted.  Anesthesia: None Laceration: None Good hemostasis noted. EBL: 150 cc  Mom and baby recovering in LDR.    Apgars: APGAR (1 MIN): 8   APGAR (5 MINS): 9   Weight: Pending skin to skin  Sponge and instrument count were correct x2. Placenta sent to L&D.  Boy (no circ), Breast feeding, and no planned birth control for mom (vasectomy).  Nicole Shirley, DO FM Resident PGY-1 02/22/2017 10:01 AM    OB FELLOW DELIVERY ATTESTATION  I was gloved and present for the delivery in its entirety, and I agree with the above resident's note.    Nicole MowElizabeth Kealohilani Maiorino, DO OB Fellow 12:37 PM

## 2016-08-23 ENCOUNTER — Encounter: Payer: Self-pay | Admitting: Obstetrics & Gynecology

## 2016-08-23 DIAGNOSIS — R87810 Cervical high risk human papillomavirus (HPV) DNA test positive: Secondary | ICD-10-CM | POA: Insufficient documentation

## 2016-08-25 ENCOUNTER — Ambulatory Visit (HOSPITAL_COMMUNITY)
Admission: RE | Admit: 2016-08-25 | Discharge: 2016-08-25 | Disposition: A | Discharge status: Home / Self Care | Payer: Medicaid Other | Source: Ambulatory Visit | Attending: Obstetrics & Gynecology | Admitting: Obstetrics & Gynecology

## 2016-08-25 ENCOUNTER — Encounter (HOSPITAL_COMMUNITY): Payer: Self-pay

## 2016-08-25 DIAGNOSIS — O99211 Obesity complicating pregnancy, first trimester: Secondary | ICD-10-CM | POA: Diagnosis not present

## 2016-08-25 DIAGNOSIS — Z6791 Unspecified blood type, Rh negative: Secondary | ICD-10-CM

## 2016-08-25 DIAGNOSIS — Z3682 Encounter for antenatal screening for nuchal translucency: Secondary | ICD-10-CM | POA: Insufficient documentation

## 2016-08-25 DIAGNOSIS — Z3A12 12 weeks gestation of pregnancy: Secondary | ICD-10-CM | POA: Insufficient documentation

## 2016-08-25 DIAGNOSIS — O09899 Supervision of other high risk pregnancies, unspecified trimester: Secondary | ICD-10-CM

## 2016-08-25 DIAGNOSIS — O09211 Supervision of pregnancy with history of pre-term labor, first trimester: Secondary | ICD-10-CM | POA: Diagnosis not present

## 2016-08-25 DIAGNOSIS — E669 Obesity, unspecified: Secondary | ICD-10-CM | POA: Diagnosis not present

## 2016-08-25 DIAGNOSIS — O09291 Supervision of pregnancy with other poor reproductive or obstetric history, first trimester: Secondary | ICD-10-CM | POA: Insufficient documentation

## 2016-08-25 DIAGNOSIS — O26899 Other specified pregnancy related conditions, unspecified trimester: Secondary | ICD-10-CM

## 2016-08-25 DIAGNOSIS — O0990 Supervision of high risk pregnancy, unspecified, unspecified trimester: Secondary | ICD-10-CM

## 2016-08-25 DIAGNOSIS — Z3401 Encounter for supervision of normal first pregnancy, first trimester: Secondary | ICD-10-CM

## 2016-08-25 DIAGNOSIS — O09219 Supervision of pregnancy with history of pre-term labor, unspecified trimester: Secondary | ICD-10-CM

## 2016-08-25 DIAGNOSIS — Z9049 Acquired absence of other specified parts of digestive tract: Secondary | ICD-10-CM

## 2016-08-28 ENCOUNTER — Other Ambulatory Visit: Payer: Self-pay | Admitting: Obstetrics & Gynecology

## 2016-08-28 ENCOUNTER — Telehealth: Payer: Self-pay | Admitting: *Deleted

## 2016-08-28 NOTE — Telephone Encounter (Signed)
Per message from Dr. Penne LashLeggett hpr+ for #16 , needs colposcopy. Need to notify pt. I scheduled colpo with her next ob visit 09/11/16. I called Vikki PortsValerie and notified her of pap results and plan to do at her next ob visit. She voices understanding and states wants to see Dr. Penne LashLeggett again with the colpo/ob visit. I explained her visit was scheduled with Dr. Penne LashLeggett.

## 2016-09-05 ENCOUNTER — Encounter: Payer: Self-pay | Admitting: Obstetrics & Gynecology

## 2016-09-05 DIAGNOSIS — O285 Abnormal chromosomal and genetic finding on antenatal screening of mother: Secondary | ICD-10-CM | POA: Insufficient documentation

## 2016-09-07 ENCOUNTER — Ambulatory Visit: Payer: Self-pay

## 2016-09-11 ENCOUNTER — Ambulatory Visit: Payer: Self-pay

## 2016-09-11 ENCOUNTER — Encounter: Payer: Self-pay | Admitting: Obstetrics & Gynecology

## 2016-09-11 NOTE — BH Specialist Note (Deleted)
Session Start time: ***   End Time: *** Total Time:  *** Type of Service: Behavioral Health - Individual/Family Interpreter: No.   Interpreter Name & Language: n/a # [facility]  SUBJECTIVE: [name] is a 31 y.o. female  Pt. was referred by Dr Penne LashLeggett for:  anxiety and depression. Pt. reports the following symptoms/concerns: *** Duration of problem:3 months   Severity: {DESC;mild/moderate/severe:33302} Previous treatment: ***  OBJECTIVE: Mood: {BHH MOOD:22306} & Affect: {BHH AFFECT:22307} Risk of harm to self or others: *** Assessments administered: ***  LIFE CONTEXT:  Family & Social: Lives with husband and 9yo daughter Product/process development scientistchool/ Work: ***  Self-Care: Hot baths for stress; some sleeping/eating issues related to early pregnancy  Life changes: Current pregnancy What is important to pt/family (values): Overall wellbeing, family  GOALS ADDRESSED:  -Reduce symptoms of anxiety and depression  INTERVENTIONS: Solution Focused   ASSESSMENT:  Pt currently experiencing 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  Pt may benefit from brief therapeutic intervention regarding coping with symptoms of anxiety and depression.   PLAN: 1. F/U with behavioral health clinician: *** 2. Behavioral Health meds: none 3. Behavioral recommendations: *** 4. Referral: Brief Counseling/Psychotherapy 5. From scale of 1-10, how likely are you to follow plan: ***  Woc-Behavioral Health Clinician  Behavioral Health Clinician  Warmhandoff: no  Depression screen [facility] 2/9 08/10/2016  Decreased Interest 2  Down, Depressed, Hopeless 3  PHQ - 2 Score 5  Altered sleeping 2  Tired, decreased energy 3  Change in appetite 2  Feeling bad or failure about yourself  3  Trouble concentrating 2  Moving slowly or fidgety/restless 0  Suicidal thoughts 0  PHQ-9 Score 17   GAD 7 : Generalized Anxiety Score 08/10/2016  Nervous, Anxious, on Edge 2  Control/stop worrying  2  Worry too much - different things 2  Trouble relaxing 2  Restless 1  Easily annoyed or irritable 1  Afraid - awful might happen 1  Total GAD 7 Score 11

## 2016-09-21 ENCOUNTER — Ambulatory Visit (INDEPENDENT_AMBULATORY_CARE_PROVIDER_SITE_OTHER): Payer: Medicaid Other | Admitting: Family Medicine

## 2016-09-21 VITALS — BP 109/64 | HR 75 | Wt 201.0 lb

## 2016-09-21 DIAGNOSIS — O285 Abnormal chromosomal and genetic finding on antenatal screening of mother: Secondary | ICD-10-CM

## 2016-09-21 DIAGNOSIS — O09899 Supervision of other high risk pregnancies, unspecified trimester: Secondary | ICD-10-CM

## 2016-09-21 DIAGNOSIS — O09212 Supervision of pregnancy with history of pre-term labor, second trimester: Secondary | ICD-10-CM

## 2016-09-21 DIAGNOSIS — R87810 Cervical high risk human papillomavirus (HPV) DNA test positive: Secondary | ICD-10-CM | POA: Diagnosis not present

## 2016-09-21 DIAGNOSIS — O0990 Supervision of high risk pregnancy, unspecified, unspecified trimester: Secondary | ICD-10-CM

## 2016-09-21 DIAGNOSIS — O09219 Supervision of pregnancy with history of pre-term labor, unspecified trimester: Secondary | ICD-10-CM

## 2016-09-21 NOTE — Progress Notes (Signed)
   PRENATAL VISIT NOTE  Subjective:  Nicole Haley is a 31 y.o. [phone] at 28110w5d being seen today for ongoing prenatal care.  She is currently monitored for the following issues for this high-risk pregnancy and has GERD (gastroesophageal reflux disease); Obesity (BMI 30-39.9); Depression with anxiety; Supervision of high risk pregnancy, antepartum; History of preterm delivery, currently pregnant; History of cholecystectomy; Rh negative state in antepartum period; Pap smear of cervix shows high risk HPV present; and Abnormal genetic test in pregnancy on her problem list.  Patient reports no complaints.  Contractions: Not present. Vag. Bleeding: None.  Movement: Absent. Denies leaking of fluid.   The following portions of the patient's history were reviewed and updated as appropriate: allergies, current medications, past family history, past medical history, past social history, past surgical history and problem list. Problem list updated.  Objective:   Vitals:   09/21/16 0950  BP: 109/64  Pulse: 75  Weight: 201 lb (91.2 kg)    Fetal Status: Fetal Heart Rate (bpm): 145   Movement: Absent     General:  Alert, oriented and cooperative. Patient is in no acute distress.  Skin: Skin is warm and dry. No rash noted.   Cardiovascular: Normal heart rate noted  Respiratory: Normal respiratory effort, no problems with respiration noted  Abdomen: Soft, gravid, appropriate for gestational age. Pain/Pressure: Absent     Pelvic:  Cervical exam deferred        Extremities: Normal range of motion.  Edema: None  Mental Status: Normal mood and affect. Normal behavior. Normal judgment and thought content.    Assessment and Plan:  Pregnancy: [id number] at 23110w5d  1. Supervision of high risk pregnancy, antepartum FHT and FH normal  2. History of preterm delivery, currently pregnant  3. Abnormal genetic test in pregnancy Low PAPP-A. At risk for IUGR. Will need to pay attention to fundal heights, possible  US at 28 weeks for growth  4. Pap smear of cervix shows high risk HPV present Colposcopy done. No samples taken. Will need repeat colpo after delivery.  Preterm labor symptoms and general obstetric precautions including but not limited to vaginal bleeding, contractions, leaking of fluid and fetal movement were reviewed in detail with the patient. Please refer to After Visit Summary for other counseling recommendations.  No Follow-up on file.   Levie HeritageJacob J Tramayne Sebesta, DO

## 2016-09-21 NOTE — Progress Notes (Signed)
Colposcopy Note:  PAP: normal cytology, + 16 HPV Pt smoker  Patient given informed consent, signed copy in the chart, time out was performed.  Placed in lithotomy position. Cervix viewed with speculum and colposcope after application of acetic acid.   Colposcopy adequate?  TMZ seen 360 Acetowhite lesions? no Punctation? no Mosaicism?  no Abnormal vasculature?  no Biopsies? no ECC? no   Will need to repeat colposcopy following delivery for ECC.

## 2016-09-25 ENCOUNTER — Encounter: Payer: Self-pay | Admitting: *Deleted

## 2016-10-05 ENCOUNTER — Ambulatory Visit (HOSPITAL_COMMUNITY)
Admission: RE | Admit: 2016-10-05 | Discharge: 2016-10-05 | Disposition: A | Discharge status: Home / Self Care | Payer: Medicaid Other | Source: Ambulatory Visit | Attending: Family Medicine | Admitting: Family Medicine

## 2016-10-05 ENCOUNTER — Encounter (HOSPITAL_COMMUNITY): Payer: Self-pay

## 2016-10-05 ENCOUNTER — Other Ambulatory Visit: Payer: Self-pay | Admitting: Family Medicine

## 2016-10-05 ENCOUNTER — Other Ambulatory Visit (HOSPITAL_COMMUNITY): Payer: Self-pay | Admitting: *Deleted

## 2016-10-05 ENCOUNTER — Encounter: Payer: Self-pay | Admitting: Obstetrics & Gynecology

## 2016-10-05 DIAGNOSIS — O09892 Supervision of other high risk pregnancies, second trimester: Secondary | ICD-10-CM

## 2016-10-05 DIAGNOSIS — O09212 Supervision of pregnancy with history of pre-term labor, second trimester: Secondary | ICD-10-CM | POA: Diagnosis not present

## 2016-10-05 DIAGNOSIS — O09899 Supervision of other high risk pregnancies, unspecified trimester: Secondary | ICD-10-CM

## 2016-10-05 DIAGNOSIS — E669 Obesity, unspecified: Secondary | ICD-10-CM | POA: Insufficient documentation

## 2016-10-05 DIAGNOSIS — O281 Abnormal biochemical finding on antenatal screening of mother: Secondary | ICD-10-CM

## 2016-10-05 DIAGNOSIS — Z363 Encounter for antenatal screening for malformations: Secondary | ICD-10-CM | POA: Diagnosis not present

## 2016-10-05 DIAGNOSIS — Z3A18 18 weeks gestation of pregnancy: Secondary | ICD-10-CM | POA: Diagnosis not present

## 2016-10-05 DIAGNOSIS — O0990 Supervision of high risk pregnancy, unspecified, unspecified trimester: Secondary | ICD-10-CM

## 2016-10-05 DIAGNOSIS — Z6791 Unspecified blood type, Rh negative: Secondary | ICD-10-CM | POA: Diagnosis not present

## 2016-10-05 DIAGNOSIS — O280 Abnormal hematological finding on antenatal screening of mother: Principal | ICD-10-CM

## 2016-10-05 DIAGNOSIS — O09292 Supervision of pregnancy with other poor reproductive or obstetric history, second trimester: Secondary | ICD-10-CM

## 2016-10-05 DIAGNOSIS — O99212 Obesity complicating pregnancy, second trimester: Secondary | ICD-10-CM | POA: Diagnosis not present

## 2016-10-05 DIAGNOSIS — O26893 Other specified pregnancy related conditions, third trimester: Secondary | ICD-10-CM | POA: Diagnosis not present

## 2016-10-05 DIAGNOSIS — O283 Abnormal ultrasonic finding on antenatal screening of mother: Secondary | ICD-10-CM | POA: Insufficient documentation

## 2016-10-05 DIAGNOSIS — O09219 Supervision of pregnancy with history of pre-term labor, unspecified trimester: Secondary | ICD-10-CM

## 2016-10-09 ENCOUNTER — Telehealth: Payer: Self-pay | Admitting: *Deleted

## 2016-10-09 NOTE — Telephone Encounter (Signed)
Called pt to answer her question regarding the gender of her baby from US.  She stated that someone had already responded to her via My Chart.  She had no further questions.

## 2016-10-09 NOTE — Telephone Encounter (Signed)
Patient left message on nurse voicemail on 10/06/16 at [phone].  States she would like to confirm the gender of her baby from her result ultrasound results.  Requests a return call to [phone].

## 2016-10-11 ENCOUNTER — Encounter: Payer: Self-pay | Admitting: Obstetrics & Gynecology

## 2016-10-24 ENCOUNTER — Encounter: Payer: Self-pay | Admitting: Family

## 2016-11-07 ENCOUNTER — Ambulatory Visit (INDEPENDENT_AMBULATORY_CARE_PROVIDER_SITE_OTHER): Payer: Medicaid Other | Admitting: Advanced Practice Midwife

## 2016-11-07 VITALS — BP 119/63 | HR 91 | Wt 206.2 lb

## 2016-11-07 DIAGNOSIS — O09212 Supervision of pregnancy with history of pre-term labor, second trimester: Secondary | ICD-10-CM

## 2016-11-07 DIAGNOSIS — O358XX0 Maternal care for other (suspected) fetal abnormality and damage, not applicable or unspecified: Secondary | ICD-10-CM

## 2016-11-07 DIAGNOSIS — O283 Abnormal ultrasonic finding on antenatal screening of mother: Secondary | ICD-10-CM | POA: Diagnosis not present

## 2016-11-07 DIAGNOSIS — O26899 Other specified pregnancy related conditions, unspecified trimester: Principal | ICD-10-CM

## 2016-11-07 DIAGNOSIS — Z6791 Unspecified blood type, Rh negative: Secondary | ICD-10-CM

## 2016-11-07 DIAGNOSIS — O35EXX0 Maternal care for other (suspected) fetal abnormality and damage, fetal genitourinary anomalies, not applicable or unspecified: Secondary | ICD-10-CM

## 2016-11-07 DIAGNOSIS — O09899 Supervision of other high risk pregnancies, unspecified trimester: Secondary | ICD-10-CM

## 2016-11-07 DIAGNOSIS — O285 Abnormal chromosomal and genetic finding on antenatal screening of mother: Secondary | ICD-10-CM

## 2016-11-07 DIAGNOSIS — O0990 Supervision of high risk pregnancy, unspecified, unspecified trimester: Secondary | ICD-10-CM

## 2016-11-07 DIAGNOSIS — O09219 Supervision of pregnancy with history of pre-term labor, unspecified trimester: Secondary | ICD-10-CM

## 2016-11-07 NOTE — Progress Notes (Signed)
   PRENATAL VISIT NOTE  Subjective:  Nicole Haley is a 31 y.o. [phone] [id number] at 6339w3d being seen today for ongoing prenatal care.  She is currently monitored for the following issues for this high-risk pregnancy and has GERD (gastroesophageal reflux disease); Obesity (BMI 30-39.9); Depression with anxiety; Supervision of high risk pregnancy, antepartum; History of preterm delivery, currently pregnant; History of cholecystectomy; Rh negative state in antepartum period; Pap smear of cervix shows high risk HPV present; and Abnormal genetic test in pregnancy on her problem list.  Patient reports no complaints.  Contractions: Not present. Vag. Bleeding: None.  Movement: Present. Denies leaking of fluid.   The following portions of the patient's history were reviewed and updated as appropriate: allergies, current medications, past family history, past medical history, past social history, past surgical history and problem list. Problem list updated.  Reviewed records from [facility] in 2009. No estimation of gestational age was documented. Patient was not aware that she was pregnant. Arrived at hospital cramping and bleeding and in processes of passing fetus. Pathology report describes baby as macerated w/ skin slippage, Placenta showed signs of of chorio. These findings point toward fetal demise a while before [facility], not cervical insufficiency.   Objective:   Vitals:   11/07/16 0840  BP: 119/63  Pulse: 91  Weight: 206 lb 3.2 oz (93.5 kg)    Fetal Status: Fetal Heart Rate (bpm): 125 Fundal Height: 25 cm Movement: Present     General:  Alert, oriented and cooperative. Patient is in no acute distress.  //Skin: Skin is warm and dry. No rash noted.   Cardiovascular: Normal heart rate noted  Respiratory: Normal respiratory effort, no problems with respiration noted  Abdomen: Soft, gravid, appropriate for gestational age. Pain/Pressure: Present     Pelvic:  Cervical exam deferred        Extremities: Normal range  of motion.  Edema: None  Mental Status: Normal mood and affect. Normal behavior. Normal judgment and thought content.     Assessment and Plan:  Pregnancy: [id number] at 6239w3d  1. Abnormal genetic test in pregnancy - Growth US 11/16/16  2. History of preterm delivery, currently pregnant - PTL precautions  3. Supervision of high risk pregnancy, antepartum   4. Rh negative state in antepartum period  - Rhophylac at [place].  Preterm labor symptoms and general obstetric precautions including but not limited to vaginal bleeding, contractions, leaking of fluid and fetal movement were reviewed in detail with the patient. Please refer to After Visit Summary for other counseling recommendations.  Return in about 4 weeks (around 12/05/2016) for ROB/GTT.  Dorathy KinsmanVirginia Swetha Rayle, CNM

## 2016-11-07 NOTE — Patient Instructions (Signed)
Tdap Vaccine (Tetanus, Diphtheria and Pertussis): What You Need to Know 1. Why get vaccinated? Tetanus, diphtheria and pertussis are very serious diseases. Tdap vaccine can protect us from these diseases. And, Tdap vaccine given to pregnant women can protect newborn babies against pertussis. TETANUS (Lockjaw) is rare in the [place] today. It causes painful muscle tightening and stiffness, usually all over the body.  It can lead to tightening of muscles in the head and neck so you can't open your mouth, swallow, or sometimes even breathe. Tetanus kills about 1 out of 10 people who are infected even after receiving the best medical care. DIPHTHERIA is also rare in the [place] today. It can cause a thick coating to form in the back of the throat.  It can lead to breathing problems, heart failure, paralysis, and death. PERTUSSIS (Whooping Cough) causes severe coughing spells, which can cause difficulty breathing, vomiting and disturbed sleep.  It can also lead to weight loss, incontinence, and rib fractures. Up to 2 in 100 adolescents and 5 in 100 adults with pertussis are hospitalized or have complications, which could include pneumonia or death. These diseases are caused by bacteria. Diphtheria and pertussis are spread from person to person through secretions from coughing or sneezing. Tetanus enters the body through cuts, scratches, or wounds. Before vaccines, as many as 200,000 cases of diphtheria, 200,000 cases of pertussis, and hundreds of cases of tetanus, were reported in the [place] each year. Since vaccination began, reports of cases for tetanus and diphtheria have dropped by about 99% and for pertussis by about 80%. 2. Tdap vaccine Tdap vaccine can protect adolescents and adults from tetanus, diphtheria, and pertussis. One dose of Tdap is routinely given at age 11 or 12. People who did not get Tdap at that age should get it as soon as possible. Tdap is especially important  for healthcare professionals and anyone having close contact with a baby younger than 12 months. Pregnant women should get a dose of Tdap during every pregnancy, to protect the newborn from pertussis. Infants are most at risk for severe, life-threatening complications from pertussis. Another vaccine, called Td, protects against tetanus and diphtheria, but not pertussis. A Td booster should be given every 10 years. Tdap may be given as one of these boosters if you have never gotten Tdap before. Tdap may also be given after a severe cut or burn to prevent tetanus infection. Your doctor or the person giving you the vaccine can give you more information. Tdap may safely be given at the same time as other vaccines. 3. Some people should not get this vaccine  A person who has ever had a life-threatening allergic reaction after a previous dose of any diphtheria, tetanus or pertussis containing vaccine, OR has a severe allergy to any part of this vaccine, should not get Tdap vaccine. Tell the person giving the vaccine about any severe allergies.  Anyone who had coma or long repeated seizures within 7 days after a childhood dose of DTP or DTaP, or a previous dose of Tdap, should not get Tdap, unless a cause other than the vaccine was found. They can still get Td.  Talk to your doctor if you:  have seizures or another nervous system problem,  had severe pain or swelling after any vaccine containing diphtheria, tetanus or pertussis,  ever had a condition called Guillain-Barr Syndrome (GBS),  aren't feeling well on the day the shot is scheduled. 4. Risks With any medicine, including vaccines, there is   a chance of side effects. These are usually mild and go away on their own. Serious reactions are also possible but are rare. Most people who get Tdap vaccine do not have any problems with it. Mild problems following Tdap:  (Did not interfere with activities)  Pain where the shot was given (about 3 in 4  adolescents or 2 in 3 adults)  Redness or swelling where the shot was given (about 1 person in 5)  Mild fever of at least 100.24F (up to about 1 in 25 adolescents or 1 in 100 adults)  Headache (about 3 or 4 people in 10)  Tiredness (about 1 person in 3 or 4)  Nausea, vomiting, diarrhea, stomach ache (up to 1 in 4 adolescents or 1 in 10 adults)  Chills, sore joints (about 1 person in 10)  Body aches (about 1 person in 3 or 4)  Rash, swollen glands (uncommon) Moderate problems following Tdap:  (Interfered with activities, but did not require medical attention)  Pain where the shot was given (up to 1 in 5 or 6)  Redness or swelling where the shot was given (up to about 1 in 16 adolescents or 1 in 12 adults)  Fever over 102F (about 1 in 100 adolescents or 1 in 250 adults)  Headache (about 1 in 7 adolescents or 1 in 10 adults)  Nausea, vomiting, diarrhea, stomach ache (up to 1 or 3 people in 100)  Swelling of the entire arm where the shot was given (up to about 1 in 500). Severe problems following Tdap:  (Unable to perform usual activities; required medical attention)  Swelling, severe pain, bleeding and redness in the arm where the shot was given (rare). Problems that could happen after any vaccine:   People sometimes faint after a medical procedure, including vaccination. Sitting or lying down for about 15 minutes can help prevent fainting, and injuries caused by a fall. Tell your doctor if you feel dizzy, or have vision changes or ringing in the ears.  Some people get severe pain in the shoulder and have difficulty moving the arm where a shot was given. This happens very rarely.  Any medication can cause a severe allergic reaction. Such reactions from a vaccine are very rare, estimated at fewer than 1 in a million doses, and would happen within a few minutes to a few hours after the vaccination. As with any medicine, there is a very remote chance of a vaccine causing a  serious injury or death. The safety of vaccines is always being monitored. For more information, visit: http://www.aguilar.org/ 5. What if there is a serious problem? What should I look for?  Look for anything that concerns you, such as signs of a severe allergic reaction, very high fever, or unusual behavior. Signs of a severe allergic reaction can include hives, swelling of the face and throat, difficulty breathing, a fast heartbeat, dizziness, and weakness. These would usually start a few minutes to a few hours after the vaccination. What should I do?   If you think it is a severe allergic reaction or other emergency that can't wait, call 9-1-1 or get the person to the nearest hospital. Otherwise, call your doctor.  Afterward, the reaction should be reported to the Vaccine Adverse Event Reporting System (VAERS). Your doctor might file this report, or you can do it yourself through the VAERS web site at www.vaers.SamedayNews.es, or by calling 2392699893.  VAERS does not give medical advice. 6. The National Vaccine Injury [employer]  Vaccine Injury Compensation Program (VICP) is a federal program that was created to compensate people who may have been injured by certain vaccines. Persons who believe they may have been injured by a vaccine can learn about the program and about filing a claim by calling 1-514-520-9790 or visiting the VICP website at SpiritualWord.atwww.hrsa.gov/vaccinecompensation. There is a time limit to file a claim for compensation. 7. How can I learn more?  Ask your doctor. He or she can give you the vaccine package insert or suggest other sources of information.  Call your local or state health department.  Contact the Centers for Disease Control and Prevention (CDC):  Call 512-859-48501-781-674-5773 (1-800-CDC-INFO) or  Visit CDC's website at PicCapture.uywww.cdc.gov/vaccines CDC Tdap Vaccine VIS (10/14/13) This information is not intended to replace advice given to you by your health  care provider. Make sure you discuss any questions you have with your health care provider. Document Released: 02/06/2012 Document Revised: 04/27/2016 Document Reviewed: 04/27/2016 Elsevier Interactive Patient Education  2017 [employer]   Preterm Labor and Birth Information The normal length of a pregnancy is 39-41 weeks. Preterm labor is when labor starts before 37 completed weeks of pregnancy. What are the risk factors for preterm labor? Preterm labor is more likely to occur in women who:  Have certain infections during pregnancy such as a bladder infection, sexually transmitted infection, or infection inside the uterus (chorioamnionitis).  Have a shorter-than-normal cervix.  Have gone into preterm labor before.  Have had surgery on their cervix.  Are younger than age 31 or older than age 31.  Are African American.  Are pregnant with twins or multiple babies (multiple gestation).  Take street drugs or smoke while pregnant.  Do not gain enough weight while pregnant.  Became pregnant shortly after having been pregnant. What are the symptoms of preterm labor? Symptoms of preterm labor include:  Cramps similar to those that can happen during a menstrual period. The cramps may happen with diarrhea.  Pain in the abdomen or lower back.  Regular uterine contractions that may feel like tightening of the abdomen.  A feeling of increased pressure in the pelvis.  Increased watery or bloody mucus discharge from the vagina.  Water breaking (ruptured amniotic sac). Why is it important to recognize signs of preterm labor? It is important to recognize signs of preterm labor because babies who are born prematurely may not be fully developed. This can put them at an increased risk for:  Long-term (chronic) heart and lung problems.  Difficulty immediately after birth with regulating body systems, including blood sugar, body temperature, heart rate, and breathing rate.  Bleeding in  the brain.  Cerebral palsy.  Learning difficulties.  Death. These risks are highest for babies who are born before 34 weeks of pregnancy. How is preterm labor treated? Treatment depends on the length of your pregnancy, your condition, and the health of your baby. It may involve:  Having a stitch (suture) placed in your cervix to prevent your cervix from opening too early (cerclage).  Taking or being given medicines, such as:  Hormone medicines. These may be given early in pregnancy to help support the pregnancy.  Medicine to stop contractions.  Medicines to help mature the baby's lungs. These may be prescribed if the risk of delivery is high.  Medicines to prevent your baby from developing cerebral palsy. If the labor happens before 34 weeks of pregnancy, you may need to stay in the hospital. What should I do if I think I am in  in preterm labor? If you think that you are going into preterm labor, call your health care provider right away. How can I prevent preterm labor in future pregnancies? To increase your chance of having a full-term pregnancy:  Do not use any tobacco products, such as cigarettes, chewing tobacco, and e-cigarettes. If you need help quitting, ask your health care provider.  Do not use street drugs or medicines that have not been prescribed to you during your pregnancy.  Talk with your health care provider before taking any herbal supplements, even if you have been taking them regularly.  Make sure you gain a healthy amount of weight during your pregnancy.  Watch for infection. If you think that you might have an infection, get it checked right away.  Make sure to tell your health care provider if you have gone into preterm labor before. This information is not intended to replace advice given to you by your health care provider. Make sure you discuss any questions you have with your health care provider. Document Released: 10/28/2003 Document Revised:  01/18/2016 Document Reviewed: 12/29/2015 Elsevier Interactive Patient Education  2017 Elsevier Inc.  

## 2016-11-08 DIAGNOSIS — O358XX0 Maternal care for other (suspected) fetal abnormality and damage, not applicable or unspecified: Secondary | ICD-10-CM | POA: Insufficient documentation

## 2016-11-08 DIAGNOSIS — O35EXX0 Maternal care for other (suspected) fetal abnormality and damage, fetal genitourinary anomalies, not applicable or unspecified: Secondary | ICD-10-CM | POA: Insufficient documentation

## 2016-11-09 ENCOUNTER — Encounter: Payer: Self-pay | Admitting: Advanced Practice Midwife

## 2016-11-16 ENCOUNTER — Other Ambulatory Visit (HOSPITAL_COMMUNITY): Payer: Self-pay | Admitting: *Deleted

## 2016-11-16 ENCOUNTER — Ambulatory Visit (HOSPITAL_COMMUNITY)
Admission: RE | Admit: 2016-11-16 | Discharge: 2016-11-16 | Disposition: A | Discharge status: Home / Self Care | Payer: Medicaid Other | Source: Ambulatory Visit | Attending: Family Medicine | Admitting: Family Medicine

## 2016-11-16 ENCOUNTER — Encounter (HOSPITAL_COMMUNITY): Payer: Self-pay

## 2016-11-16 DIAGNOSIS — Z3A24 24 weeks gestation of pregnancy: Secondary | ICD-10-CM | POA: Diagnosis not present

## 2016-11-16 DIAGNOSIS — O09292 Supervision of pregnancy with other poor reproductive or obstetric history, second trimester: Secondary | ICD-10-CM | POA: Diagnosis not present

## 2016-11-16 DIAGNOSIS — Z6791 Unspecified blood type, Rh negative: Secondary | ICD-10-CM | POA: Diagnosis not present

## 2016-11-16 DIAGNOSIS — O99212 Obesity complicating pregnancy, second trimester: Secondary | ICD-10-CM | POA: Diagnosis present

## 2016-11-16 DIAGNOSIS — O283 Abnormal ultrasonic finding on antenatal screening of mother: Secondary | ICD-10-CM | POA: Insufficient documentation

## 2016-11-16 DIAGNOSIS — O26892 Other specified pregnancy related conditions, second trimester: Secondary | ICD-10-CM | POA: Insufficient documentation

## 2016-11-16 DIAGNOSIS — O35EXX0 Maternal care for other (suspected) fetal abnormality and damage, fetal genitourinary anomalies, not applicable or unspecified: Secondary | ICD-10-CM

## 2016-11-16 DIAGNOSIS — Z363 Encounter for antenatal screening for malformations: Secondary | ICD-10-CM | POA: Diagnosis not present

## 2016-11-16 DIAGNOSIS — E669 Obesity, unspecified: Secondary | ICD-10-CM | POA: Insufficient documentation

## 2016-11-16 DIAGNOSIS — O09899 Supervision of other high risk pregnancies, unspecified trimester: Secondary | ICD-10-CM

## 2016-11-16 DIAGNOSIS — O280 Abnormal hematological finding on antenatal screening of mother: Secondary | ICD-10-CM

## 2016-11-16 DIAGNOSIS — O358XX0 Maternal care for other (suspected) fetal abnormality and damage, not applicable or unspecified: Secondary | ICD-10-CM

## 2016-12-05 ENCOUNTER — Encounter: Payer: Self-pay | Admitting: Family Medicine

## 2016-12-06 ENCOUNTER — Encounter: Payer: Self-pay | Admitting: Family Medicine

## 2016-12-07 ENCOUNTER — Encounter: Payer: Self-pay | Admitting: Family Medicine

## 2016-12-07 ENCOUNTER — Ambulatory Visit (INDEPENDENT_AMBULATORY_CARE_PROVIDER_SITE_OTHER): Payer: Medicaid Other | Admitting: Student

## 2016-12-07 VITALS — BP 107/66 | HR 81 | Wt 207.5 lb

## 2016-12-07 DIAGNOSIS — O26899 Other specified pregnancy related conditions, unspecified trimester: Secondary | ICD-10-CM

## 2016-12-07 DIAGNOSIS — O09212 Supervision of pregnancy with history of pre-term labor, second trimester: Secondary | ICD-10-CM

## 2016-12-07 DIAGNOSIS — O0990 Supervision of high risk pregnancy, unspecified, unspecified trimester: Secondary | ICD-10-CM

## 2016-12-07 DIAGNOSIS — Z23 Encounter for immunization: Secondary | ICD-10-CM | POA: Diagnosis not present

## 2016-12-07 DIAGNOSIS — O360920 Maternal care for other rhesus isoimmunization, second trimester, not applicable or unspecified: Secondary | ICD-10-CM

## 2016-12-07 DIAGNOSIS — O09892 Supervision of other high risk pregnancies, second trimester: Secondary | ICD-10-CM

## 2016-12-07 DIAGNOSIS — Z6791 Unspecified blood type, Rh negative: Secondary | ICD-10-CM

## 2016-12-07 DIAGNOSIS — O0993 Supervision of high risk pregnancy, unspecified, third trimester: Secondary | ICD-10-CM

## 2016-12-07 DIAGNOSIS — O09219 Supervision of pregnancy with history of pre-term labor, unspecified trimester: Secondary | ICD-10-CM

## 2016-12-07 DIAGNOSIS — O09899 Supervision of other high risk pregnancies, unspecified trimester: Secondary | ICD-10-CM

## 2016-12-07 LAB — POCT URINALYSIS DIP (DEVICE)
BILIRUBIN URINE: NEGATIVE
GLUCOSE, UA: NEGATIVE mg/dL
HGB URINE DIPSTICK: NEGATIVE
KETONES UR: NEGATIVE mg/dL
LEUKOCYTES UA: NEGATIVE
Nitrite: NEGATIVE
Protein, ur: NEGATIVE mg/dL
SPECIFIC GRAVITY, URINE: 1.02 (ref 1.005–1.030)
UROBILINOGEN UA: 0.2 mg/dL (ref 0.0–1.0)
pH: 7 (ref 5.0–8.0)

## 2016-12-07 MED ORDER — RHO D IMMUNE GLOBULIN 1500 UNIT/2ML IJ SOSY: 300.0000 ug | PREFILLED_SYRINGE | Freq: Once | INTRAMUSCULAR | 0 refills | Status: DC

## 2016-12-07 MED ORDER — RHO D IMMUNE GLOBULIN 1500 UNIT/2ML IJ SOSY
300.0000 ug | PREFILLED_SYRINGE | Freq: Once | INTRAMUSCULAR | Status: AC
  Administered 2016-12-07: 300 ug via INTRAMUSCULAR

## 2016-12-07 NOTE — Patient Instructions (Signed)
Glucose Tolerance Test The glucose tolerance test (GTT) is one of several tests used to diagnose diabetes mellitus. The GTT is a blood test, and it may include a urine test as well. The GTT checks to see how your body processes sugar (glucose). For this test, you will consume a drink containing a high level of glucose. Your blood glucose levels will be checked before you consume the drink and then again 1, 2, 3, and possibly 4 hours after you consume it. Your health care provider may recommend that you have the GTT if you:  Have a family history of diabetes.  Are very overweight (obese).  Have experienced infections that keep coming back.  Have had numerous cuts or wounds that did not heal quickly, especially on your legs and feet.  Are a woman and have a history of giving birth to very large babies or a history of repeated fetal loss (stillbirth).  Have had glucose in your urine or high blood sugar: ? During pregnancy. ? After a heart attack, surgery, or prolonged periods of high stress.  The GTT lasts 3-4 hours. Other than the glucose solution, you will not be allowed to eat or drink anything during the test. You must remain at the testing location to make sure that your blood and urine samples are taken on time. How do I prepare for this test? Eat normally for 3 days prior to the GTT test, including having plenty of carbohydrate-rich foods. Do not eat or drink anything except water during the final 12 hours before the test. You should not smoke or exercise during the test. In addition, your health care provider may ask you to stop taking certain medicines before the test. What do the results mean? It is your responsibility to obtain your test results. Ask the lab or department performing the test when and how you will get your results. Contact your health care provider to discuss any questions you have about your results. Range of Normal Values Ranges for normal values may vary among  different labs and hospitals. You should always check with your health care provider after having lab work or other tests done to discuss whether your values are considered within normal limits. Normal levels of blood glucose are as follows:  Fasting: less than 110 mg/dL or less than 6.1 mmol/L (SI units).  1 hour after consuming the glucose drink: less than 200 mg/dL or less than 11.1 mmol/L.  2 hours after consuming the glucose drink: less than 140 mg/dL or less than 7.8 mmol/L.  3 hours after consuming the glucose drink: 70-115 mg/dL or less than 6.4 mmol/L.  4 hours after consuming the glucose drink: 70-115 mg/dL or less than 6.4 mmol/L.  The normal result for the urine test is negative, meaning that glucose is absent from your urine. Some substances can interfere with GTT results. These may include:  Blood pressure and heart failure medicines, including beta blockers, furosemide, and thiazides.  Anti-inflammatory medicines, including aspirin.  Nicotine.  Some psychiatric medicines.  Oral contraceptives.  Diuretics or corticosteroids.  Meaning of Results Outside Normal Value Ranges GTT test results that are above normal values may indicate health problems, such as:  Diabetes mellitus.  Acute stress response.  Cushing syndrome.  Tumors such as pheochromocytoma or glucagonoma.  Chronic renal failure.  Pancreatitis.  Hyperthyroidism.  Current infection.  Discuss your test results with your health care provider. He or she will use the results to make a diagnosis and determine a treatment plan   that is right for you. Talk with your health care provider to discuss your results, treatment options, and if necessary, the need for more tests. Talk with your health care provider if you have any questions about your results. This information is not intended to replace advice given to you by your health care provider. Make sure you discuss any questions you have with your  health care provider. Document Released: 08/30/2004 Document Revised: 04/11/2016 Document Reviewed: 12/12/2013 Elsevier Interactive Patient Education  2017 Elsevier Inc.  

## 2016-12-07 NOTE — Progress Notes (Signed)
   PRENATAL VISIT NOTE  Subjective:  Nicole Haley is a 31 y.o. [phone] at [redacted]w[redacted]d being seen today for ongoing prenatal care.  She is currently monitored for the following issues for this high-risk pregnancy and has GERD (gastroesophageal reflux disease); Obesity (BMI 30-39.9); Depression with anxiety; Supervision of high risk pregnancy, antepartum; History of preterm delivery, currently pregnant; History of cholecystectomy; Rh negative state in antepartum period; Pap smear of cervix shows high risk HPV present; Abnormal genetic test in pregnancy; and Pyelectasis of fetus on prenatal ultrasound on her problem list.  Patient reports no complaints.  Contractions: Not present. Vag. Bleeding: None.  Movement: Present. Denies leaking of fluid.   The following portions of the patient's history were reviewed and updated as appropriate: allergies, current medications, past family history, past medical history, past social history, past surgical history and problem list. Problem list updated.  Objective:   Vitals:   12/07/16 0811  BP: 107/66  Pulse: 81  Weight: 94.1 kg (207 lb 8 oz)    Fetal Status: Fetal Heart Rate (bpm): 141 Fundal Height: 27 cm Movement: Present     General:  Alert, oriented and cooperative. Patient is in no acute distress.  Skin: Skin is warm and dry. No rash noted.   Cardiovascular: Normal heart rate noted  Respiratory: Normal respiratory effort, no problems with respiration noted  Abdomen: Soft, gravid, appropriate for gestational age. Pain/Pressure: Absent     Pelvic:  Cervical exam deferred        Extremities: Normal range of motion.  Edema: None  Mental Status: Normal mood and affect. Normal behavior. Normal judgment and thought content.   Assessment and Plan:  Pregnancy: [id number] at [redacted]w[redacted]d  1. Supervision of high risk pregnancy in third trimester  - CBC - RPR - HIV antibody (with reflex) - Glucose tolerance, 2 hours -MFM [place] scheduled for 12/14/2016 -Fundal height  appropriate today 2. History of preterm delivery, currently pregnant   3. Rh negative state in antepartum period Rhogam given today  Preterm labor symptoms and general obstetric precautions including but not limited to vaginal bleeding, contractions, leaking of fluid and fetal movement were reviewed in detail with the patient. Please refer to After Visit Summary for other counseling recommendations.    Marylene Land, CNM

## 2016-12-08 LAB — GLUCOSE TOLERANCE, 2 HOURS
GLUCOSE, 2 HOUR: 100 mg/dL (ref 65–139)
Glucose, GTT - Fasting: 85 mg/dL (ref 65–99)

## 2016-12-08 LAB — CBC
Hematocrit: 32.8 % — ABNORMAL LOW (ref 34.0–46.6)
Hemoglobin: 11.1 g/dL (ref 11.1–15.9)
MCH: 32.2 pg (ref 26.6–33.0)
MCHC: 33.8 g/dL (ref 31.5–35.7)
MCV: 95 fL (ref 79–97)
PLATELETS: 179 10*3/uL (ref 150–379)
RBC: 3.45 x10E6/uL — AB (ref 3.77–5.28)
RDW: 13.1 % (ref 12.3–15.4)
WBC: 9.9 10*3/uL (ref 3.4–10.8)

## 2016-12-08 LAB — RPR: RPR: NONREACTIVE

## 2016-12-08 LAB — HIV ANTIBODY (ROUTINE TESTING W REFLEX): HIV SCREEN 4TH GENERATION: NONREACTIVE

## 2016-12-14 ENCOUNTER — Other Ambulatory Visit (HOSPITAL_COMMUNITY): Payer: Self-pay | Admitting: Obstetrics and Gynecology

## 2016-12-14 ENCOUNTER — Ambulatory Visit (HOSPITAL_COMMUNITY)
Admission: RE | Admit: 2016-12-14 | Discharge: 2016-12-14 | Disposition: A | Discharge status: Home / Self Care | Payer: Medicaid Other | Source: Ambulatory Visit | Attending: Family Medicine | Admitting: Family Medicine

## 2016-12-14 ENCOUNTER — Encounter (HOSPITAL_COMMUNITY): Payer: Self-pay

## 2016-12-14 VITALS — BP 106/68 | HR 80 | Wt 209.2 lb

## 2016-12-14 DIAGNOSIS — Z3A28 28 weeks gestation of pregnancy: Secondary | ICD-10-CM | POA: Diagnosis not present

## 2016-12-14 DIAGNOSIS — O35EXX0 Maternal care for other (suspected) fetal abnormality and damage, fetal genitourinary anomalies, not applicable or unspecified: Secondary | ICD-10-CM

## 2016-12-14 DIAGNOSIS — E669 Obesity, unspecified: Secondary | ICD-10-CM | POA: Insufficient documentation

## 2016-12-14 DIAGNOSIS — O99213 Obesity complicating pregnancy, third trimester: Secondary | ICD-10-CM | POA: Diagnosis not present

## 2016-12-14 DIAGNOSIS — Z6833 Body mass index (BMI) 33.0-33.9, adult: Secondary | ICD-10-CM | POA: Diagnosis not present

## 2016-12-14 DIAGNOSIS — O09219 Supervision of pregnancy with history of pre-term labor, unspecified trimester: Secondary | ICD-10-CM

## 2016-12-14 DIAGNOSIS — O358XX0 Maternal care for other (suspected) fetal abnormality and damage, not applicable or unspecified: Secondary | ICD-10-CM

## 2016-12-14 DIAGNOSIS — O283 Abnormal ultrasonic finding on antenatal screening of mother: Secondary | ICD-10-CM | POA: Diagnosis not present

## 2016-12-14 NOTE — ED Notes (Signed)
Leaking very small amt of clear watery fluid off and on during the last week.  Denies cramping or bleeding.

## 2016-12-15 ENCOUNTER — Other Ambulatory Visit (HOSPITAL_COMMUNITY): Payer: Self-pay | Admitting: Obstetrics and Gynecology

## 2016-12-15 DIAGNOSIS — O09219 Supervision of pregnancy with history of pre-term labor, unspecified trimester: Secondary | ICD-10-CM

## 2016-12-15 DIAGNOSIS — O283 Abnormal ultrasonic finding on antenatal screening of mother: Secondary | ICD-10-CM

## 2016-12-15 DIAGNOSIS — Z3A28 28 weeks gestation of pregnancy: Secondary | ICD-10-CM

## 2016-12-15 DIAGNOSIS — O99213 Obesity complicating pregnancy, third trimester: Secondary | ICD-10-CM

## 2016-12-21 ENCOUNTER — Ambulatory Visit (INDEPENDENT_AMBULATORY_CARE_PROVIDER_SITE_OTHER): Payer: Medicaid Other | Admitting: Student

## 2016-12-21 VITALS — BP 104/58 | HR 73 | Wt 211.8 lb

## 2016-12-21 DIAGNOSIS — O0990 Supervision of high risk pregnancy, unspecified, unspecified trimester: Secondary | ICD-10-CM

## 2016-12-21 DIAGNOSIS — O09213 Supervision of pregnancy with history of pre-term labor, third trimester: Secondary | ICD-10-CM | POA: Diagnosis not present

## 2016-12-21 DIAGNOSIS — O0993 Supervision of high risk pregnancy, unspecified, third trimester: Secondary | ICD-10-CM

## 2016-12-21 NOTE — Progress Notes (Signed)
   PRENATAL VISIT NOTE  Subjective:  Nicole Haley is a 31 y.o. [phone] at 4794w5d being seen today for ongoing prenatal care.  She is currently monitored for the following issues for this high-risk pregnancy and has GERD (gastroesophageal reflux disease); Obesity (BMI 30-39.9); Depression with anxiety; Supervision of high risk pregnancy, antepartum; History of preterm delivery, currently pregnant; History of cholecystectomy; Rh negative state in antepartum period; Pap smear of cervix shows high risk HPV present; Abnormal genetic test in pregnancy; and Pyelectasis of fetus on prenatal ultrasound on her problem list.  Patient reports no complaints.  Contractions: Not present. Vag. Bleeding: None.  Movement: Present. Denies leaking of fluid.   The following portions of the patient's history were reviewed and updated as appropriate: allergies, current medications, past family history, past medical history, past social history, past surgical history and problem list. Problem list updated.  Objective:   Vitals:   12/21/16 0802  BP: (!) 104/58  Pulse: 73  Weight: 211 lb 12.8 oz (96.1 kg)    Fetal Status: Fetal Heart Rate (bpm): 140   Movement: Present     General:  Alert, oriented and cooperative. Patient is in no acute distress.  Skin: Skin is warm and dry. No rash noted.   Cardiovascular: Normal heart rate noted  Respiratory: Normal respiratory effort, no problems with respiration noted  Abdomen: Soft, gravid, appropriate for gestational age. Pain/Pressure: Absent     Pelvic:  Cervical exam deferred        Extremities: Normal range of motion.  Edema: None  Mental Status: Normal mood and affect. Normal behavior. Normal judgment and thought content.   Assessment and Plan:  Pregnancy: [id number] at 1794w5d  1. Supervision of high risk pregnancy, antepartum -Fundal height appropriate for gestation -Pt with questions regarding hospital & expectations surrounding delivery/pp -- escorted to  education center to sign up for hospital tour  Preterm labor symptoms and general obstetric precautions including but not limited to vaginal bleeding, contractions, leaking of fluid and fetal movement were reviewed in detail with the patient. Please refer to After Visit Summary for other counseling recommendations.  Return in about 2 weeks (around 01/04/2017) for Routine OB.   Judeth HornErin Dashayla Theissen, NP

## 2016-12-21 NOTE — Patient Instructions (Signed)
Third Trimester of Pregnancy The third trimester is from week 28 through week 40 (months 7 through 9). The third trimester is a time when the unborn baby (fetus) is growing rapidly. At the end of the ninth month, the fetus is about 20 inches in length and weighs 6-10 pounds. Body changes during your third trimester Your body will continue to go through many changes during pregnancy. The changes vary from woman to woman. During the third trimester:  Your weight will continue to increase. You can expect to gain 25-35 pounds (11-16 kg) by the end of the pregnancy.  You may begin to get stretch marks on your hips, abdomen, and breasts.  You may urinate more often because the fetus is moving lower into your pelvis and pressing on your bladder.  You may develop or continue to have heartburn. This is caused by increased hormones that slow down muscles in the digestive tract.  You may develop or continue to have constipation because increased hormones slow digestion and cause the muscles that push waste through your intestines to relax.  You may develop hemorrhoids. These are swollen veins (varicose veins) in the rectum that can itch or be painful.  You may develop swollen, bulging veins (varicose veins) in your legs.  You may have increased body aches in the pelvis, back, or thighs. This is due to weight gain and increased hormones that are relaxing your joints.  You may have changes in your hair. These can include thickening of your hair, rapid growth, and changes in texture. Some women also have hair loss during or after pregnancy, or hair that feels dry or thin. Your hair will most likely return to normal after your baby is born.  Your breasts will continue to grow and they will continue to become tender. A yellow fluid (colostrum) may leak from your breasts. This is the first milk you are producing for your baby.  Your belly button may stick out.  You may notice more swelling in your hands,  face, or ankles.  You may have increased tingling or numbness in your hands, arms, and legs. The skin on your belly may also feel numb.  You may feel short of breath because of your expanding uterus.  You may have more problems sleeping. This can be caused by the size of your belly, increased need to urinate, and an increase in your body's metabolism.  You may notice the fetus "dropping," or moving lower in your abdomen (lightening).  You may have increased vaginal discharge.  You may notice your joints feel loose and you may have pain around your pelvic bone.  What to expect at prenatal visits You will have prenatal exams every 2 weeks until week 36. Then you will have weekly prenatal exams. During a routine prenatal visit:  You will be weighed to make sure you and the baby are growing normally.  Your blood pressure will be taken.  Your abdomen will be measured to track your baby's growth.  The fetal heartbeat will be listened to.  Any test results from the previous visit will be discussed.  You may have a cervical check near your due date to see if your cervix has softened or thinned (effaced).  You will be tested for Group B streptococcus. This happens between 35 and 37 weeks.  Your health care provider may ask you:  What your birth plan is.  How you are feeling.  If you are feeling the baby move.  If you have had   any abnormal symptoms, such as leaking fluid, bleeding, severe headaches, or abdominal cramping.  If you are using any tobacco products, including cigarettes, chewing tobacco, and electronic cigarettes.  If you have any questions.  Other tests or screenings that may be performed during your third trimester include:  Blood tests that check for low iron levels (anemia).  Fetal testing to check the health, activity level, and growth of the fetus. Testing is done if you have certain medical conditions or if there are problems during the  pregnancy.  Nonstress test (NST). This test checks the health of your baby to make sure there are no signs of problems, such as the baby not getting enough oxygen. During this test, a belt is placed around your belly. The baby is made to move, and its heart rate is monitored during movement.  What is false labor? False labor is a condition in which you feel small, irregular tightenings of the muscles in the womb (contractions) that usually go away with rest, changing position, or drinking water. These are called Braxton Hicks contractions. Contractions may last for hours, days, or even weeks before true labor sets in. If contractions come at regular intervals, become more frequent, increase in intensity, or become painful, you should see your health care provider. What are the signs of labor?  Abdominal cramps.  Regular contractions that start at 10 minutes apart and become stronger and more frequent with time.  Contractions that start on the top of the uterus and spread down to the lower abdomen and back.  Increased pelvic pressure and dull back pain.  A watery or bloody mucus discharge that comes from the vagina.  Leaking of amniotic fluid. This is also known as your "water breaking." It could be a slow trickle or a gush. Let your health care provider know if it has a color or strange odor. If you have any of these signs, call your health care provider right away, even if it is before your due date. Follow these instructions at home: Medicines  Follow your health care provider's instructions regarding medicine use. Specific medicines may be either safe or unsafe to take during pregnancy.  Take a prenatal vitamin that contains at least 600 micrograms (mcg) of folic acid.  If you develop constipation, try taking a stool softener if your health care provider approves. Eating and drinking  Eat a balanced diet that includes fresh fruits and vegetables, whole grains, good sources of protein  such as meat, eggs, or tofu, and low-fat dairy. Your health care provider will help you determine the amount of weight gain that is right for you.  Avoid raw meat and uncooked cheese. These carry germs that can cause birth defects in the baby.  If you have low calcium intake from food, talk to your health care provider about whether you should take a daily calcium supplement.  Eat four or five small meals rather than three large meals a day.  Limit foods that are high in fat and processed sugars, such as fried and sweet foods.  To prevent constipation: ? Drink enough fluid to keep your urine clear or pale yellow. ? Eat foods that are high in fiber, such as fresh fruits and vegetables, whole grains, and beans. Activity  Exercise only as directed by your health care provider. Most women can continue their usual exercise routine during pregnancy. Try to exercise for 30 minutes at least 5 days a week. Stop exercising if you experience uterine contractions.  Avoid heavy   lifting.  Do not exercise in extreme heat or humidity, or at high altitudes.  Wear low-heel, comfortable shoes.  Practice good posture.  You may continue to have sex unless your health care provider tells you otherwise. Relieving pain and discomfort  Take frequent breaks and rest with your legs elevated if you have leg cramps or low back pain.  Take warm sitz baths to soothe any pain or discomfort caused by hemorrhoids. Use hemorrhoid cream if your health care provider approves.  Wear a good support bra to prevent discomfort from breast tenderness.  If you develop varicose veins: ? Wear support pantyhose or compression stockings as told by your healthcare provider. ? Elevate your feet for 15 minutes, 3-4 times a day. Prenatal care  Write down your questions. Take them to your prenatal visits.  Keep all your prenatal visits as told by your health care provider. This is important. Safety  Wear your seat belt at  all times when driving.  Make a list of emergency phone numbers, including numbers for family, friends, the hospital, and police and fire departments. General instructions  Avoid cat litter boxes and soil used by cats. These carry germs that can cause birth defects in the baby. If you have a cat, ask someone to clean the litter box for you.  Do not travel far distances unless it is absolutely necessary and only with the approval of your health care provider.  Do not use hot tubs, steam rooms, or saunas.  Do not drink alcohol.  Do not use any products that contain nicotine or tobacco, such as cigarettes and e-cigarettes. If you need help quitting, ask your health care provider.  Do not use any medicinal herbs or unprescribed drugs. These chemicals affect the formation and growth of the baby.  Do not douche or use tampons or scented sanitary pads.  Do not cross your legs for long periods of time.  To prepare for the arrival of your baby: ? Take prenatal classes to understand, practice, and ask questions about labor and delivery. ? Make a trial run to the hospital. ? Visit the hospital and tour the maternity area. ? Arrange for maternity or paternity leave through employers. ? Arrange for family and friends to take care of pets while you are in the hospital. ? Purchase a rear-facing car seat and make sure you know how to install it in your car. ? Pack your hospital bag. ? Prepare the baby's nursery. Make sure to remove all pillows and stuffed animals from the baby's crib to prevent suffocation.  Visit your dentist if you have not gone during your pregnancy. Use a soft toothbrush to brush your teeth and be gentle when you floss. Contact a health care provider if:  You are unsure if you are in labor or if your water has broken.  You become dizzy.  You have mild pelvic cramps, pelvic pressure, or nagging pain in your abdominal area.  You have lower back pain.  You have persistent  nausea, vomiting, or diarrhea.  You have an unusual or bad smelling vaginal discharge.  You have pain when you urinate. Get help right away if:  Your water breaks before 37 weeks.  You have regular contractions less than 5 minutes apart before 37 weeks.  You have a fever.  You are leaking fluid from your vagina.  You have spotting or bleeding from your vagina.  You have severe abdominal pain or cramping.  You have rapid weight loss or weight gain.    You have shortness of breath with chest pain.  You notice sudden or extreme swelling of your face, hands, ankles, feet, or legs.  Your baby makes fewer than 10 movements in 2 hours.  You have severe headaches that do not go away when you take medicine.  You have vision changes. Summary  The third trimester is from week 28 through week 40, months 7 through 9. The third trimester is a time when the unborn baby (fetus) is growing rapidly.  During the third trimester, your discomfort may increase as you and your baby continue to gain weight. You may have abdominal, leg, and back pain, sleeping problems, and an increased need to urinate.  During the third trimester your breasts will keep growing and they will continue to become tender. A yellow fluid (colostrum) may leak from your breasts. This is the first milk you are producing for your baby.  False labor is a condition in which you feel small, irregular tightenings of the muscles in the womb (contractions) that eventually go away. These are called Braxton Hicks contractions. Contractions may last for hours, days, or even weeks before true labor sets in.  Signs of labor can include: abdominal cramps; regular contractions that start at 10 minutes apart and become stronger and more frequent with time; watery or bloody mucus discharge that comes from the vagina; increased pelvic pressure and dull back pain; and leaking of amniotic fluid. This information is not intended to replace advice  given to you by your health care provider. Make sure you discuss any questions you have with your health care provider. Document Released: 08/01/2001 Document Revised: 01/13/2016 Document Reviewed: 10/08/2012 Elsevier Interactive Patient Education  2017 [employer]  

## 2017-01-09 ENCOUNTER — Encounter: Payer: Self-pay | Admitting: Medical

## 2017-01-10 ENCOUNTER — Ambulatory Visit (INDEPENDENT_AMBULATORY_CARE_PROVIDER_SITE_OTHER): Payer: Medicaid Other | Admitting: Obstetrics and Gynecology

## 2017-01-10 ENCOUNTER — Encounter: Payer: Self-pay | Admitting: Obstetrics and Gynecology

## 2017-01-10 VITALS — BP 94/59 | HR 80 | Wt 207.5 lb

## 2017-01-10 DIAGNOSIS — O358XX0 Maternal care for other (suspected) fetal abnormality and damage, not applicable or unspecified: Secondary | ICD-10-CM

## 2017-01-10 DIAGNOSIS — O09219 Supervision of pregnancy with history of pre-term labor, unspecified trimester: Secondary | ICD-10-CM

## 2017-01-10 DIAGNOSIS — O285 Abnormal chromosomal and genetic finding on antenatal screening of mother: Secondary | ICD-10-CM

## 2017-01-10 DIAGNOSIS — O09213 Supervision of pregnancy with history of pre-term labor, third trimester: Secondary | ICD-10-CM

## 2017-01-10 DIAGNOSIS — O0993 Supervision of high risk pregnancy, unspecified, third trimester: Secondary | ICD-10-CM | POA: Diagnosis not present

## 2017-01-10 DIAGNOSIS — O35EXX0 Maternal care for other (suspected) fetal abnormality and damage, fetal genitourinary anomalies, not applicable or unspecified: Secondary | ICD-10-CM

## 2017-01-10 DIAGNOSIS — O09899 Supervision of other high risk pregnancies, unspecified trimester: Secondary | ICD-10-CM

## 2017-01-10 DIAGNOSIS — O09893 Supervision of other high risk pregnancies, third trimester: Secondary | ICD-10-CM | POA: Diagnosis not present

## 2017-01-10 DIAGNOSIS — Z6791 Unspecified blood type, Rh negative: Secondary | ICD-10-CM

## 2017-01-10 DIAGNOSIS — O0990 Supervision of high risk pregnancy, unspecified, unspecified trimester: Secondary | ICD-10-CM

## 2017-01-10 DIAGNOSIS — O26899 Other specified pregnancy related conditions, unspecified trimester: Secondary | ICD-10-CM

## 2017-01-10 NOTE — Progress Notes (Signed)
Prenatal Visit Note Date: 01/10/2017 Clinic: [facility]  Subjective:  Nicole Haley is a 31 y.o. [phone] at 6078w4d being seen today for ongoing prenatal care.  She is currently monitored for the following issues for this high-risk pregnancy and has GERD (gastroesophageal reflux disease); Obesity (BMI 30-39.9); Depression with anxiety; Supervision of high risk pregnancy, antepartum; History of preterm delivery, currently pregnant; Rh negative state in antepartum period; Pap smear of cervix shows high risk HPV present; Abnormal genetic test in pregnancy; and Pyelectasis of fetus on prenatal ultrasound on her problem list.  Patient reports not much of an appetite, feels tired.  Contractions: Not present.  .  Movement: Present. Denies leaking of fluid.   The following portions of the patient's history were reviewed and updated as appropriate: allergies, current medications, past family history, past medical history, past social history, past surgical history and problem list. Problem list updated.  Objective:   Vitals:   01/10/17 0905  BP: (!) 94/59  Pulse: 80  Weight: 207 lb 8 oz (94.1 kg)    Fetal Status: Fetal Heart Rate (bpm): 141   Movement: Present     General:  Alert, oriented and cooperative. Patient is in no acute distress.  Skin: Skin is warm and dry. No rash noted.   Cardiovascular: Normal heart rate noted  Respiratory: Normal respiratory effort, no problems with respiration noted  Abdomen: Soft, gravid, appropriate for gestational age. Pain/Pressure: Absent     Pelvic:  Cervical exam deferred        Extremities: Normal range of motion.     Mental Status: Normal mood and affect. Normal behavior. Normal judgment and thought content.   Urinalysis:      Assessment and Plan:  Pregnancy: [id number] at 8278w4d  1. Supervision of high risk pregnancy, antepartum Routine care. D/w her re: grazing and staying well hydrated  2. Rh negative state in antepartum  period S/p rhogam already.  3. History of preterm delivery, currently pregnant Not on 17p  4. Abnormal genetic test in pregnancy F/u rpt growth for low papp-a  5. Pyelectasis of fetus on prenatal ultrasound F/u at rpt u/s in june  Preterm labor symptoms and general obstetric precautions including but not limited to vaginal bleeding, contractions, leaking of fluid and fetal movement were reviewed in detail with the patient. Please refer to After Visit Summary for other counseling recommendations.  Return in about 2 weeks (around 01/24/2017).   Malcolm BingPickens, Ndrew Creason, MD

## 2017-01-11 ENCOUNTER — Encounter: Payer: Self-pay | Admitting: Advanced Practice Midwife

## 2017-01-25 ENCOUNTER — Ambulatory Visit (INDEPENDENT_AMBULATORY_CARE_PROVIDER_SITE_OTHER): Payer: Medicaid Other | Admitting: Medical

## 2017-01-25 ENCOUNTER — Ambulatory Visit (HOSPITAL_COMMUNITY)
Admission: RE | Admit: 2017-01-25 | Discharge: 2017-01-25 | Disposition: A | Discharge status: Home / Self Care | Payer: Medicaid Other | Source: Ambulatory Visit | Attending: Family Medicine | Admitting: Family Medicine

## 2017-01-25 ENCOUNTER — Other Ambulatory Visit (HOSPITAL_COMMUNITY): Payer: Self-pay | Admitting: Maternal and Fetal Medicine

## 2017-01-25 VITALS — BP 110/61 | HR 71 | Wt 209.5 lb

## 2017-01-25 DIAGNOSIS — O09213 Supervision of pregnancy with history of pre-term labor, third trimester: Secondary | ICD-10-CM | POA: Insufficient documentation

## 2017-01-25 DIAGNOSIS — O99213 Obesity complicating pregnancy, third trimester: Secondary | ICD-10-CM

## 2017-01-25 DIAGNOSIS — O358XX0 Maternal care for other (suspected) fetal abnormality and damage, not applicable or unspecified: Secondary | ICD-10-CM

## 2017-01-25 DIAGNOSIS — O288 Other abnormal findings on antenatal screening of mother: Secondary | ICD-10-CM | POA: Insufficient documentation

## 2017-01-25 DIAGNOSIS — O26893 Other specified pregnancy related conditions, third trimester: Secondary | ICD-10-CM

## 2017-01-25 DIAGNOSIS — O09893 Supervision of other high risk pregnancies, third trimester: Secondary | ICD-10-CM

## 2017-01-25 DIAGNOSIS — O35EXX0 Maternal care for other (suspected) fetal abnormality and damage, fetal genitourinary anomalies, not applicable or unspecified: Secondary | ICD-10-CM

## 2017-01-25 DIAGNOSIS — E669 Obesity, unspecified: Secondary | ICD-10-CM | POA: Diagnosis not present

## 2017-01-25 DIAGNOSIS — O0990 Supervision of high risk pregnancy, unspecified, unspecified trimester: Secondary | ICD-10-CM

## 2017-01-25 DIAGNOSIS — Z3A34 34 weeks gestation of pregnancy: Secondary | ICD-10-CM | POA: Insufficient documentation

## 2017-01-25 DIAGNOSIS — O09219 Supervision of pregnancy with history of pre-term labor, unspecified trimester: Secondary | ICD-10-CM

## 2017-01-25 DIAGNOSIS — O09899 Supervision of other high risk pregnancies, unspecified trimester: Secondary | ICD-10-CM

## 2017-01-25 DIAGNOSIS — Z6791 Unspecified blood type, Rh negative: Secondary | ICD-10-CM

## 2017-01-25 DIAGNOSIS — O360130 Maternal care for anti-D [Rh] antibodies, third trimester, not applicable or unspecified: Secondary | ICD-10-CM | POA: Diagnosis not present

## 2017-01-25 DIAGNOSIS — O0993 Supervision of high risk pregnancy, unspecified, third trimester: Secondary | ICD-10-CM | POA: Diagnosis present

## 2017-01-25 NOTE — Progress Notes (Signed)
   PRENATAL VISIT NOTE  Subjective:  Nicole Haley is a 31 y.o. [id number] at 6085w5d being seen today for ongoing prenatal care.  She is currently monitored for the following issues for this high-risk pregnancy and has GERD (gastroesophageal reflux disease); Obesity (BMI 30-39.9); Depression with anxiety; Supervision of high risk pregnancy, antepartum; History of preterm delivery, currently pregnant; Rh negative state in antepartum period; Pap smear of cervix shows high risk HPV present; Abnormal genetic test in pregnancy; and Pyelectasis of fetus on prenatal ultrasound on her problem list.  Patient reports vaginal irritation.  Contractions: Not present. Vag. Bleeding: None.  Movement: Present. Denies leaking of fluid.   The following portions of the patient's history were reviewed and updated as appropriate: allergies, current medications, past family history, past medical history, past social history, past surgical history and problem list. Problem list updated.  Objective:   Vitals:   01/25/17 1001  BP: 110/61  Pulse: 71  Weight: 209 lb 8 oz (95 kg)    Fetal Status: Fetal Heart Rate (bpm): 138   Movement: Present     General:  Alert, oriented and cooperative. Patient is in no acute distress.  Skin: Skin is warm and dry. No rash noted.   Cardiovascular: Normal heart rate noted  Respiratory: Normal respiratory effort, no problems with respiration noted  Abdomen: Soft, gravid, appropriate for gestational age. Pain/Pressure: Present     Pelvic:  Cervical exam deferred        Extremities: Normal range of motion.  Edema: None  Mental Status: Normal mood and affect. Normal behavior. Normal judgment and thought content.   Assessment and Plan:  Pregnancy: [id number] at 6385w5d  1. Supervision of high risk pregnancy, antepartum - Patient doing well. Has questions about expectations for visits for the rest of the pregnancy since she delivered at 35 weeks with her last pregnancy  - All questions  answered - Reviewed follow-up US results from today   2. History of preterm delivery, currently pregnant - Preterm labor precautions discussed  Preterm labor symptoms and general obstetric precautions including but not limited to vaginal bleeding, contractions, leaking of fluid and fetal movement were reviewed in detail with the patient. Please refer to After Visit Summary for other counseling recommendations.  [facility] about 2 weeks (around 02[facility]08/2017[facility]   Vonzella NippleJulie Ellesse Antenucci, PA-C

## 2017-01-25 NOTE — Patient Instructions (Signed)
Over the-counter nystatin cream twice a day for itching Over the counter hydrocortisone cream for itching externally only up to twice day    Fetal Movement Counts Patient Name: ________________________________________________ Patient Due Date: ____________________ What is a fetal movement count? A fetal movement count is the number of times that you feel your baby move during a certain amount of time. This may also be called a fetal kick count. A fetal movement count is recommended for every pregnant woman. You may be asked to start counting fetal movements as early as week 28 of your pregnancy. Pay attention to when your baby is most active. You may notice your baby's sleep and wake cycles. You may also notice things that make your baby move more. You should do a fetal movement count:  When your baby is normally most active.  At the same time each day.  A good time to count movements is while you are resting, after having something to eat and drink. How do I count fetal movements? 1. Find a quiet, comfortable area. Sit, or lie down on your side. 2. Write down the date, the start time and stop time, and the number of movements that you felt between those two times. Take this information with you to your health care visits. 3. For 2 hours, count kicks, flutters, swishes, rolls, and jabs. You should feel at least 10 movements during 2 hours. 4. You may stop counting after you have felt 10 movements. 5. If you do not feel 10 movements in 2 hours, have something to eat and drink. Then, keep resting and counting for 1 hour. If you feel at least 4 movements during that hour, you may stop counting. Contact a health care provider if:  You feel fewer than 4 movements in 2 hours.  Your baby is not moving like he or she usually does. Date: ____________ Start time: ____________ Stop time: ____________ Movements: ____________ Date: ____________ Start time: ____________ Stop time: ____________  Movements: ____________ Date: ____________ Start time: ____________ Stop time: ____________ Movements: ____________ Date: ____________ Start time: ____________ Stop time: ____________ Movements: ____________ Date: ____________ Start time: ____________ Stop time: ____________ Movements: ____________ Date: ____________ Start time: ____________ Stop time: ____________ Movements: ____________ Date: ____________ Start time: ____________ Stop time: ____________ Movements: ____________ Date: ____________ Start time: ____________ Stop time: ____________ Movements: ____________ Date: ____________ Start time: ____________ Stop time: ____________ Movements: ____________ This information is not intended to replace advice given to you by your health care provider. Make sure you discuss any questions you have with your health care provider. Document Released: 09/06/2006 Document Revised: 04/05/2016 Document Reviewed: 09/16/2015 Elsevier Interactive Patient Education  2018 [employer]  [employer] CorporationBraxton Hicks Contractions Contractions of the uterus can occur throughout pregnancy, but they are not always a sign that you are in labor. You may have practice contractions called Braxton Hicks contractions. These false labor contractions are sometimes confused with true labor. What are Deberah PeltonBraxton Hicks contractions? Braxton Hicks contractions are tightening movements that occur in the muscles of the uterus before labor. Unlike true labor contractions, these contractions do not result in opening (dilation) and thinning of the cervix. Toward the end of pregnancy (32-34 weeks), Braxton Hicks contractions can happen more often and may become stronger. These contractions are sometimes difficult to tell apart from true labor because they can be very uncomfortable. You should not feel embarrassed if you go to the hospital with false labor. Sometimes, the only way to tell if you are in true labor is for  your health care provider to look  for changes in the cervix. The health care provider will do a physical exam and may monitor your contractions. If you are not in true labor, the exam should show that your cervix is not dilating and your water has not broken. If there are no prenatal problems or other health problems associated with your pregnancy, it is completely safe for you to be sent home with false labor. You may continue to have Braxton Hicks contractions until you go into true labor. How can I tell the difference between true labor and false labor?  Differences ? False labor ? Contractions last 30-70 seconds.: Contractions are usually shorter and not as strong as true labor contractions. ? Contractions become very regular.: Contractions are usually irregular. ? Discomfort is usually felt in the top of the uterus, and it spreads to the lower abdomen and low back.: Contractions are often felt in the front of the lower abdomen and in the groin. ? Contractions do not go away with walking.: Contractions may go away when you walk around or change positions while lying down. ? Contractions usually become more intense and increase in frequency.: Contractions get weaker and are shorter-lasting as time goes on. ? The cervix dilates and gets thinner.: The cervix usually does not dilate or become thin. Follow these instructions at home:  Take over-the-counter and prescription medicines only as told by your health care provider.  Keep up with your usual exercises and follow other instructions from your health care provider.  Eat and drink lightly if you think you are going into labor.  If Braxton Hicks contractions are making you uncomfortable: ? Change your position from lying down or resting to walking, or change from walking to resting. ? Sit and rest in a tub of warm water. ? Drink enough fluid to keep your urine clear or pale yellow. Dehydration may cause these contractions. ? Do slow and deep breathing several times an  hour.  Keep all follow-up prenatal visits as told by your health care provider. This is important. Contact a health care provider if:  You have a fever.  You have continuous pain in your abdomen. Get help right away if:  Your contractions become stronger, more regular, and closer together.  You have fluid leaking or gushing from your vagina.  You pass blood-tinged mucus (bloody show).  You have bleeding from your vagina.  You have low back pain that you never had before.  You feel your baby's head pushing down and causing pelvic pressure.  Your baby is not moving inside you as much as it used to. Summary  Contractions that occur before labor are called Braxton Hicks contractions, false labor, or practice contractions.  Braxton Hicks contractions are usually shorter, weaker, farther apart, and less regular than true labor contractions. True labor contractions usually become progressively stronger and regular and they become more frequent.  Manage discomfort from [facility] contractions by changing position, resting in a warm bath, drinking plenty of water, or practicing deep breathing. This information is not intended to replace advice given to you by your health care provider. Make sure you discuss any questions you have with your health care provider. Document Released: 08/07/2005 Document Revised: 06/26/2016 Document Reviewed: 06/26/2016 Elsevier Interactive Patient Education  2017 [employer].

## 2017-02-02 ENCOUNTER — Encounter: Payer: Self-pay | Admitting: Obstetrics & Gynecology

## 2017-02-07 ENCOUNTER — Ambulatory Visit (INDEPENDENT_AMBULATORY_CARE_PROVIDER_SITE_OTHER): Payer: Medicaid Other | Admitting: Obstetrics and Gynecology

## 2017-02-07 ENCOUNTER — Other Ambulatory Visit (HOSPITAL_COMMUNITY)
Admission: RE | Admit: 2017-02-07 | Discharge: 2017-02-07 | Disposition: A | Discharge status: Home / Self Care | Payer: Medicaid Other | Source: Ambulatory Visit | Attending: Obstetrics & Gynecology | Admitting: Obstetrics & Gynecology

## 2017-02-07 VITALS — BP 106/63 | HR 80 | Wt 209.4 lb

## 2017-02-07 DIAGNOSIS — Z113 Encounter for screening for infections with a predominantly sexual mode of transmission: Secondary | ICD-10-CM | POA: Diagnosis not present

## 2017-02-07 DIAGNOSIS — O0993 Supervision of high risk pregnancy, unspecified, third trimester: Secondary | ICD-10-CM

## 2017-02-07 DIAGNOSIS — O0990 Supervision of high risk pregnancy, unspecified, unspecified trimester: Secondary | ICD-10-CM

## 2017-02-07 DIAGNOSIS — O358XX0 Maternal care for other (suspected) fetal abnormality and damage, not applicable or unspecified: Secondary | ICD-10-CM

## 2017-02-07 DIAGNOSIS — Z6791 Unspecified blood type, Rh negative: Secondary | ICD-10-CM

## 2017-02-07 DIAGNOSIS — O285 Abnormal chromosomal and genetic finding on antenatal screening of mother: Secondary | ICD-10-CM

## 2017-02-07 DIAGNOSIS — Z3A36 36 weeks gestation of pregnancy: Secondary | ICD-10-CM | POA: Diagnosis not present

## 2017-02-07 DIAGNOSIS — O26899 Other specified pregnancy related conditions, unspecified trimester: Secondary | ICD-10-CM

## 2017-02-07 DIAGNOSIS — O09893 Supervision of other high risk pregnancies, third trimester: Secondary | ICD-10-CM

## 2017-02-07 DIAGNOSIS — O35EXX0 Maternal care for other (suspected) fetal abnormality and damage, fetal genitourinary anomalies, not applicable or unspecified: Secondary | ICD-10-CM

## 2017-02-07 NOTE — Progress Notes (Signed)
Prenatal Visit Note Date: 02/07/2017 Clinic: [facility]  Subjective:  Nicole Haley is a 31 y.o. [phone] at 5420w4d being seen today for ongoing prenatal care.  She is currently monitored for the following issues for this low-risk pregnancy and has GERD (gastroesophageal reflux disease); Obesity (BMI 30-39.9); Depression with anxiety; Supervision of high risk pregnancy, antepartum; History of preterm delivery, currently pregnant; Rh negative state in antepartum period; Pap smear of cervix shows high risk HPV present; Abnormal genetic test in pregnancy; and Pyelectasis of fetus on prenatal ultrasound on her problem list.  Patient reports no complaints.   Contractions: Not present. Vag. Bleeding: None.  Movement: Present. Denies leaking of fluid.   The following portions of the patient's history were reviewed and updated as appropriate: allergies, current medications, past family history, past medical history, past social history, past surgical history and problem list. Problem list updated.  Objective:   Vitals:   02/07/17 0828  BP: 106/63  Pulse: 80  Weight: 209 lb 6.4 oz (95 kg)    Fetal Status: Fetal Heart Rate (bpm): 145 Fundal Height: 36 cm Movement: Present  Presentation: Vertex  General:  Alert, oriented and cooperative. Patient is in no acute distress.  Skin: Skin is warm and dry. No rash noted.   Cardiovascular: Normal heart rate noted  Respiratory: Normal respiratory effort, no problems with respiration noted  Abdomen: Soft, gravid, appropriate for gestational age. Pain/Pressure: Present     Pelvic:  Cervical exam deferred        Extremities: Normal range of motion.  Edema: None  Mental Status: Normal mood and affect. Normal behavior. Normal judgment and thought content.   Urinalysis:      Assessment and Plan:  Pregnancy: [id number] at 5320w4d  1. Supervision of high risk pregnancy, antepartum, third trimester Routine care. Normal growth early June and no  need for rpts.  - Strep Gp B NAA - Cervicovaginal ancillary only  Preterm labor symptoms and general obstetric precautions including but not limited to vaginal bleeding, contractions, leaking of fluid and fetal movement were reviewed in detail with the patient. Please refer to After Visit Summary for other counseling recommendations.  Return in about 1 week (around 02/14/2017) for 7-10d rob.   Winslow BingPickens, Jakirah Zaun, MD

## 2017-02-07 NOTE — Progress Notes (Signed)
36 wk labs today Breastfeeding tip reviewed

## 2017-02-08 LAB — CERVICOVAGINAL ANCILLARY ONLY
CHLAMYDIA, DNA PROBE: NEGATIVE
NEISSERIA GONORRHEA: NEGATIVE

## 2017-02-09 LAB — STREP GP B NAA: Strep Gp B NAA: NEGATIVE

## 2017-02-14 ENCOUNTER — Ambulatory Visit (INDEPENDENT_AMBULATORY_CARE_PROVIDER_SITE_OTHER): Payer: Medicaid Other | Admitting: Student

## 2017-02-14 VITALS — BP 103/77 | HR 87 | Wt 210.0 lb

## 2017-02-14 DIAGNOSIS — O35EXX0 Maternal care for other (suspected) fetal abnormality and damage, fetal genitourinary anomalies, not applicable or unspecified: Secondary | ICD-10-CM

## 2017-02-14 DIAGNOSIS — O09219 Supervision of pregnancy with history of pre-term labor, unspecified trimester: Principal | ICD-10-CM

## 2017-02-14 DIAGNOSIS — O09893 Supervision of other high risk pregnancies, third trimester: Secondary | ICD-10-CM

## 2017-02-14 DIAGNOSIS — O09213 Supervision of pregnancy with history of pre-term labor, third trimester: Secondary | ICD-10-CM

## 2017-02-14 DIAGNOSIS — O358XX0 Maternal care for other (suspected) fetal abnormality and damage, not applicable or unspecified: Secondary | ICD-10-CM

## 2017-02-14 DIAGNOSIS — O26899 Other specified pregnancy related conditions, unspecified trimester: Secondary | ICD-10-CM

## 2017-02-14 DIAGNOSIS — O09899 Supervision of other high risk pregnancies, unspecified trimester: Secondary | ICD-10-CM

## 2017-02-14 DIAGNOSIS — Z029 Encounter for administrative examinations, unspecified: Secondary | ICD-10-CM

## 2017-02-14 DIAGNOSIS — Z6791 Unspecified blood type, Rh negative: Secondary | ICD-10-CM | POA: Diagnosis not present

## 2017-02-14 DIAGNOSIS — O0990 Supervision of high risk pregnancy, unspecified, unspecified trimester: Secondary | ICD-10-CM

## 2017-02-14 NOTE — Patient Instructions (Signed)
Before Baby Comes Home  Before your baby arrives it is important to:   Have all of the supplies that you will need to care for your baby.   Know where to go if there is an emergency.   Discuss the baby's arrival with other family members.    What supplies will I need?    It is recommended that you have the following supplies:  Large Items   Crib.   Crib mattress.   Rear-facing infant car seat. If possible, have a trained professional check to make sure that it is installed correctly.    Feeding   6-8 bottles that are 4-5 oz in size.   6-8 nipples.   Bottle brush.   Sterilizer, or a large pan or kettle with a lid.   A way to boil and cool water.   If you will be breastfeeding:  ? Breast pump.  ? Nipple cream.  ? Nursing bra.  ? Breast pads.  ? Breast shields.   If you will be formula feeding:  ? Formula.  ? Measuring cups.  ? Measuring spoons.    Bathing   Mild baby soap and baby shampoo.   Petroleum jelly.   Soft cloth towel and washcloth.   Hooded towel.   Cotton balls.   Bath basin.    Other Supplies   Rectal thermometer.   Bulb syringe.   Baby wipes or washcloths for diaper changes.   Diaper bag.   Changing pad.   Clothing, including one-piece outfits and pajamas.   Baby nail clippers.   Receiving blankets.   Mattress pad and sheets for the crib.   Night-light for the baby's room.   Baby monitor.   2 or 3 pacifiers.   Either 24-36 cloth diapers and waterproof diaper covers or a box of disposable diapers. You may need to use as many as 10-12 diapers per day.    How do I prepare for an emergency?  Prepare for an emergency by:   Knowing how to get to the nearest hospital.   Listing the phone numbers of your baby's health care providers near your home phone and in your cell phone.    How do I prepare my family?   Decide how to handle visitors.   If you have other children:  ? Talk with them about the baby coming home. Ask them how they feel about it.  ? Read a book together about  being a new big brother or sister.  ? Find ways to let them help you prepare for the new baby.  ? Have someone ready to care for them while you are in the hospital.  This information is not intended to replace advice given to you by your health care provider. Make sure you discuss any questions you have with your health care provider.  Document Released: 07/20/2008 Document Revised: 01/13/2016 Document Reviewed: 07/15/2014  Elsevier Interactive Patient Education  2018 Elsevier Inc.

## 2017-02-14 NOTE — Progress Notes (Signed)
   PRENATAL VISIT NOTE  Subjective:  Nicole Haley is a 31 y.o. [phone] at 7911w4d being seen today for ongoing prenatal care.  She is currently monitored for the following issues for this high-risk pregnancy and has GERD (gastroesophageal reflux disease); Obesity (BMI 30-39.9); Depression with anxiety; Supervision of high risk pregnancy, antepartum; History of preterm delivery, currently pregnant; Rh negative state in antepartum period; Pap smear of cervix shows high risk HPV present; Abnormal genetic test in pregnancy; and Pyelectasis of fetus on prenatal ultrasound on her problem list.  Patient reports no complaints.  Contractions: Irritability. Vag. Bleeding: None.  Movement: Present. Denies leaking of fluid.   The following portions of the patient's history were reviewed and updated as appropriate: allergies, current medications, past family history, past medical history, past social history, past surgical history and problem list. Problem list updated.  Objective:   Vitals:   02/14/17 1007  BP: 103/77  Pulse: 87  Weight: 210 lb (95.3 kg)    Fetal Status: Fetal Heart Rate (bpm): 141 Fundal Height: 38 cm Movement: Present     General:  Alert, oriented and cooperative. Patient is in no acute distress.  Skin: Skin is warm and dry. No rash noted.   Cardiovascular: Normal heart rate noted  Respiratory: Normal respiratory effort, no problems with respiration noted  Abdomen: Soft, gravid, appropriate for gestational age. Pain/Pressure: Present     Pelvic:  Cervical exam deferred        Extremities: Normal range of motion.  Edema: None  Mental Status: Normal mood and affect. Normal behavior. Normal judgment and thought content.   Assessment and Plan:  Pregnancy: [id number] at 2911w4d  1. History of preterm delivery, currently pregnant   2. Pyelectasis of fetus on prenatal ultrasound -notify peds at delivery  3. Rh negative state in antepartum period -rhogham given at 28 wks  4.  Supervision of high risk pregnancy, antepartum   Term labor symptoms and general obstetric precautions including but not limited to vaginal bleeding, contractions, leaking of fluid and fetal movement were reviewed in detail with the patient. Please refer to After Visit Summary for other counseling recommendations.  Return in about 1 week (around 02/21/2017) for Routine OB.   Judeth HornErin Alezander Dimaano, NP

## 2017-02-14 NOTE — Progress Notes (Signed)
Patient reports increase in headaches recently- denies blurry vision or dizziness

## 2017-02-22 ENCOUNTER — Encounter: Payer: Self-pay | Admitting: Family Medicine

## 2017-02-22 ENCOUNTER — Inpatient Hospital Stay (HOSPITAL_COMMUNITY)
Admission: AD | Admit: 2017-02-22 | Discharge: 2017-02-23 | DRG: 775 | Disposition: A | Discharge status: Home / Self Care | Payer: Medicaid Other | Source: Ambulatory Visit | Attending: Family Medicine | Admitting: Family Medicine

## 2017-02-22 ENCOUNTER — Encounter (HOSPITAL_COMMUNITY): Payer: Self-pay | Admitting: *Deleted

## 2017-02-22 DIAGNOSIS — F1290 Cannabis use, unspecified, uncomplicated: Secondary | ICD-10-CM | POA: Diagnosis present

## 2017-02-22 DIAGNOSIS — Z87891 Personal history of nicotine dependence: Secondary | ICD-10-CM

## 2017-02-22 DIAGNOSIS — O26893 Other specified pregnancy related conditions, third trimester: Secondary | ICD-10-CM | POA: Diagnosis present

## 2017-02-22 DIAGNOSIS — Z6791 Unspecified blood type, Rh negative: Secondary | ICD-10-CM | POA: Diagnosis not present

## 2017-02-22 DIAGNOSIS — Z3A38 38 weeks gestation of pregnancy: Secondary | ICD-10-CM

## 2017-02-22 DIAGNOSIS — O09219 Supervision of pregnancy with history of pre-term labor, unspecified trimester: Secondary | ICD-10-CM

## 2017-02-22 DIAGNOSIS — O35EXX0 Maternal care for other (suspected) fetal abnormality and damage, fetal genitourinary anomalies, not applicable or unspecified: Secondary | ICD-10-CM

## 2017-02-22 DIAGNOSIS — O358XX0 Maternal care for other (suspected) fetal abnormality and damage, not applicable or unspecified: Secondary | ICD-10-CM

## 2017-02-22 DIAGNOSIS — O0990 Supervision of high risk pregnancy, unspecified, unspecified trimester: Secondary | ICD-10-CM

## 2017-02-22 DIAGNOSIS — O26899 Other specified pregnancy related conditions, unspecified trimester: Secondary | ICD-10-CM

## 2017-02-22 DIAGNOSIS — O99324 Drug use complicating childbirth: Secondary | ICD-10-CM | POA: Diagnosis present

## 2017-02-22 DIAGNOSIS — Z3493 Encounter for supervision of normal pregnancy, unspecified, third trimester: Secondary | ICD-10-CM | POA: Diagnosis present

## 2017-02-22 DIAGNOSIS — O09899 Supervision of other high risk pregnancies, unspecified trimester: Secondary | ICD-10-CM

## 2017-02-22 LAB — CBC
HEMATOCRIT: 37.7 % (ref 36.0–46.0)
Hemoglobin: 13.4 g/dL (ref 12.0–15.0)
MCH: 33 pg (ref 26.0–34.0)
MCHC: 35.5 g/dL (ref 30.0–36.0)
MCV: 92.9 fL (ref 78.0–100.0)
PLATELETS: 151 10*3/uL (ref 150–400)
RBC: 4.06 MIL/uL (ref 3.87–5.11)
RDW: 13.1 % (ref 11.5–15.5)
WBC: 13.1 10*3/uL — ABNORMAL HIGH (ref 4.0–10.5)

## 2017-02-22 LAB — ABO/RH: ABO/RH(D): A NEG

## 2017-02-22 LAB — TYPE AND SCREEN
ABO/RH(D): A NEG
ANTIBODY SCREEN: NEGATIVE

## 2017-02-22 LAB — RPR: RPR Ser Ql: NONREACTIVE

## 2017-02-22 MED ORDER — BENZOCAINE-MENTHOL 20-0.5 % EX AERO: 1.0000 "application " | INHALATION_SPRAY | CUTANEOUS | Status: DC | PRN

## 2017-02-22 MED ORDER — LACTATED RINGERS IV SOLN: 500.0000 mL | INTRAVENOUS | Status: DC | PRN

## 2017-02-22 MED ORDER — PRENATAL MULTIVITAMIN CH
1.0000 | ORAL_TABLET | Freq: Every day | ORAL | Status: DC
  Administered 2017-02-22 – 2017-02-23 (×2): 1 via ORAL
  Filled 2017-02-22 (×2): qty 1

## 2017-02-22 MED ORDER — PRENATAL MULTIVITAMIN CH: 1.0000 | ORAL_TABLET | Freq: Every day | ORAL | Status: DC

## 2017-02-22 MED ORDER — SIMETHICONE 80 MG PO CHEW: 80.0000 mg | CHEWABLE_TABLET | ORAL | Status: DC | PRN

## 2017-02-22 MED ORDER — FLEET ENEMA 7-19 GM/118ML RE ENEM: 1.0000 | ENEMA | RECTAL | Status: DC | PRN

## 2017-02-22 MED ORDER — ZOLPIDEM TARTRATE 5 MG PO TABS: 5.0000 mg | ORAL_TABLET | Freq: Every evening | ORAL | Status: DC | PRN

## 2017-02-22 MED ORDER — DIPHENHYDRAMINE HCL 25 MG PO CAPS: 25.0000 mg | ORAL_CAPSULE | Freq: Four times a day (QID) | ORAL | Status: DC | PRN

## 2017-02-22 MED ORDER — COCONUT OIL OIL: 1.0000 "application " | TOPICAL_OIL | Status: DC | PRN

## 2017-02-22 MED ORDER — ONDANSETRON HCL 4 MG PO TABS: 4.0000 mg | ORAL_TABLET | ORAL | Status: DC | PRN

## 2017-02-22 MED ORDER — ONDANSETRON HCL 4 MG/2ML IJ SOLN
4.0000 mg | INTRAMUSCULAR | Status: DC | PRN
Start: 2017-02-22 — End: 2017-02-23

## 2017-02-22 MED ORDER — WITCH HAZEL-GLYCERIN EX PADS
1.0000 | MEDICATED_PAD | CUTANEOUS | Status: DC | PRN
Start: 2017-02-22 — End: 2017-02-23

## 2017-02-22 MED ORDER — LIDOCAINE HCL (PF) 1 % IJ SOLN
30.0000 mL | INTRAMUSCULAR | Status: DC | PRN
  Filled 2017-02-22: qty 30

## 2017-02-22 MED ORDER — SENNOSIDES-DOCUSATE SODIUM 8.6-50 MG PO TABS
2.0000 | ORAL_TABLET | ORAL | Status: DC
  Administered 2017-02-22: 2 via ORAL
  Filled 2017-02-22: qty 2

## 2017-02-22 MED ORDER — ACETAMINOPHEN 325 MG PO TABS: 650.0000 mg | ORAL_TABLET | ORAL | Status: DC | PRN

## 2017-02-22 MED ORDER — DIBUCAINE 1 % RE OINT: 1.0000 "application " | TOPICAL_OINTMENT | RECTAL | Status: DC | PRN

## 2017-02-22 MED ORDER — ONDANSETRON HCL 4 MG/2ML IJ SOLN: 4.0000 mg | Freq: Four times a day (QID) | INTRAMUSCULAR | Status: DC | PRN

## 2017-02-22 MED ORDER — FENTANYL CITRATE (PF) 100 MCG/2ML IJ SOLN
100.0000 ug | INTRAMUSCULAR | Status: DC | PRN
  Administered 2017-02-22 (×2): 100 ug via INTRAVENOUS
  Filled 2017-02-22 (×2): qty 2

## 2017-02-22 MED ORDER — LACTATED RINGERS IV SOLN
INTRAVENOUS | Status: DC
  Administered 2017-02-22: 125 mL via INTRAVENOUS

## 2017-02-22 MED ORDER — IBUPROFEN 600 MG PO TABS
600.0000 mg | ORAL_TABLET | Freq: Four times a day (QID) | ORAL | Status: DC
  Administered 2017-02-22 – 2017-02-23 (×5): 600 mg via ORAL
  Filled 2017-02-22 (×6): qty 1

## 2017-02-22 MED ORDER — OXYCODONE-ACETAMINOPHEN 5-325 MG PO TABS: 2.0000 | ORAL_TABLET | ORAL | Status: DC | PRN

## 2017-02-22 MED ORDER — OXYCODONE-ACETAMINOPHEN 5-325 MG PO TABS
1.0000 | ORAL_TABLET | ORAL | Status: DC | PRN
  Filled 2017-02-22: qty 1

## 2017-02-22 MED ORDER — SOD CITRATE-CITRIC ACID 500-334 MG/5ML PO SOLN: 30.0000 mL | ORAL | Status: DC | PRN

## 2017-02-22 MED ORDER — OXYTOCIN BOLUS FROM INFUSION: 500.0000 mL | Freq: Once | INTRAVENOUS | Status: DC

## 2017-02-22 MED ORDER — TETANUS-DIPHTH-ACELL PERTUSSIS 5-2.5-18.5 LF-MCG/0.5 IM SUSP: 0.5000 mL | Freq: Once | INTRAMUSCULAR | Status: DC

## 2017-02-22 MED ORDER — OXYTOCIN 40 UNITS IN LACTATED RINGERS INFUSION - SIMPLE MED
2.5000 [IU]/h | INTRAVENOUS | Status: DC
  Administered 2017-02-22: 2.5 [IU]/h via INTRAVENOUS
  Filled 2017-02-22: qty 1000

## 2017-02-22 NOTE — H&P (Signed)
LABOR ADMISSION HISTORY AND PHYSICAL  Nicole Haley is a 31 y.o. female [phone] with IUP at 3148w5d by LMP presenting for SOL. History of preterm labor at 35 weeks from SOL, not given any treatment for this pregnancy. She reports +FMs, No LOF, no VB, no blurry vision, headaches or peripheral edema, and RUQ pain.  She plans on breast feeding. She is undecided for birth control.   Dating: By LMP --->  Estimated Date of Delivery: 03/03/17   Prenatal History/Complications:  Past Medical History: Past Medical History:  Diagnosis Date  . Anemia    low iron? never been able to give blood, but not diagnosed  . Gallstones   . GERD (gastroesophageal reflux disease)   . Headache(784.0)   . History of cholecystectomy 08/10/2016  . Sciatica     Past Surgical History: Past Surgical History:  Procedure Laterality Date  . CHOLECYSTECTOMY N/A 09/22/2013   Procedure: LAPAROSCOPIC CHOLECYSTECTOMY WITH INTRAOPERATIVE CHOLANGIOGRAM;  Surgeon: Kandis Cockingavid H Newman, MD;  Location: [facility];  Service: General;  Laterality: N/A;  . DILATION AND CURETTAGE OF UTERUS  2009   spontaneous abortion  . DILATION AND CURETTAGE OF UTERUS  2006  . WISDOM TOOTH EXTRACTION  2006   x 4    Obstetrical History: OB History    Gravida Para Term Preterm AB Living   8 2   2 5 1    SAB TAB Ectopic Multiple Live Births   2 3     2       Social History: Social History   Social History  . Marital status: Divorced    Spouse name: N/A  . Number of children: N/A  . Years of education: N/A   Social History Main Topics  . Smoking status: Former Smoker    Packs/day: 0.50    Years: 15.00    Types: Cigarettes  . Smokeless tobacco: Never Used  . Alcohol use Yes     Comment: none with pregnancy  . Drug use: Yes    Frequency: 7.0 times per week    Types: Marijuana     Comment: none with pregnancy  . Sexual activity: No   Other Topics Concern  . None   Social History Narrative  . None    Family History: Family History   Problem Relation Age of Onset  . Diabetes Mother   . Hypertension Mother   . Hypertension Father     Allergies: No Known Allergies  Prescriptions Prior to Admission  Medication Sig Dispense Refill Last Dose  . acetaminophen (TYLENOL) 325 MG tablet Take 650 mg by mouth every 6 (six) hours as needed.   Taking  . Prenatal Vit-Fe Fumarate-FA (MULTIVITAMIN-PRENATAL) 27-0.8 MG TABS tablet Take 1 tablet by mouth daily at 12 noon.   Taking     Review of Systems   All systems reviewed and negative except as stated in HPI  Blood pressure 124/61, pulse 72, temperature 97.6 F (36.4 C), temperature source Oral, resp. rate 19, height 5\' 7"  (1.702 m), weight 210 lb (95.3 kg), last menstrual period 05/27/2016. General appearance: alert, cooperative and moderate distress Extremities: Homans sign is negative, no sign of DVT Presentation: cephalic Fetal monitoringBaseline: 125 bpm, Variability: moderate, Accelerations: present and Decelerations: none Uterine activityFrequency: Every 2-3 min minutes Dilation: 5.5 Effacement (%): 100 Station: 0 Exam by:: m wilkins rnc   Prenatal labs: ABO, Rh: A/NEG/-- (12/21 1037) Antibody: NEG (12/21 1037) Rubella: 1.77  RPR: Non Reactive (04/19 0918)  HBsAg: NEGATIVE (12/21 1037)  HIV: Non Reactive (  04/19 1610)  GBS: Negative (06/20 0916)    Prenatal Transfer Tool  Maternal Diabetes: No Genetic Screening: Normal, low risk Maternal Ultrasounds/Referrals: Abnormal:  Findings:   Other:Bilateral pyelectasis, rpt US showed fetal urinary tract dialtion- inform pediatrics at delivery Fetal Ultrasounds or other Referrals:  None Maternal Substance Abuse:  No Significant Maternal Medications:  None Significant Maternal Lab Results: Lab values include: Group B Strep negative, Other:  Low PAPP-A  Results for orders placed or performed during the hospital encounter of 02/22/17 (from the past 24 hour(s))  CBC   Collection Time: 02/22/17  8:10 AM  Result  Value Ref Range   WBC 13.1 (H) 4.0 - 10.5 K/uL   RBC 4.06 3.87 - 5.11 MIL/uL   Hemoglobin 13.4 12.0 - 15.0 g/dL   HCT 96.0 45.4 - 09.8 %   MCV 92.9 78.0 - 100.0 fL   MCH 33.0 26.0 - 34.0 pg   MCHC 35.5 30.0 - 36.0 g/dL   RDW 11.9 14.7 - 82.9 %   Platelets 151 150 - 400 K/uL    Patient Active Problem List   Diagnosis Date Noted  . Normal labor 02/22/2017  . Pyelectasis of fetus on prenatal ultrasound 11/08/2016  . Abnormal genetic test in pregnancy 09/05/2016  . Pap smear of cervix shows high risk HPV present 08/23/2016  . Rh negative state in antepartum period 08/12/2016  . Supervision of high risk pregnancy, antepartum 08/10/2016  . History of preterm delivery, currently pregnant 08/10/2016  . Obesity (BMI 30-39.9) 09/22/2013  . Depression with anxiety 09/22/2013  . GERD (gastroesophageal reflux disease)     Assessment: Nicole Haley is a 31 y.o. [id number] at [redacted]w[redacted]d here for SOL.  #Labor:expectant management #Pain: No epidural, fentanyl 100 mcg #FWB: Cat 1 #ID: GBS neg #MOF: breast #MOC:unsure, likely vasectomy #Circ:  no  Nicole Shirley, DO Family Medicine Resident PGY-1  02/22/2017, 9:00 AM  OB FELLOW HISTORY AND PHYSICAL ATTESTATION  I have seen and examined this patient; I agree with above documentation in the resident's note.    Jen Mow, DO OB Fellow 02/22/2017, 12:38 PM

## 2017-02-22 NOTE — Lactation Note (Signed)
This note was copied from a baby's chart. Lactation Consultation Note  Patient Name: Boy Claud KelpValerie Staffieri UJWJX'BToday's Date: 02/22/2017 Reason for consult: Initial assessment   Initial consult with first time BF mom of < 1 hour old infant [facility]. Mom had infant STS and infant was on and off the breast. Mom with soft compressible breasts and areola with everted nipples. There is a minimal amount of edema noted to areola. Showed mom how to hand express and large gtts colostrum noted.   Infant latched on and off the breast. He was fussy at times. He settled into a good rhythmic suckling pattern towards enc of visit. He was noted to have rhythmic suckles and intermittent swallows that increased with breast compression. Worked with mom on BF basics, hand expression, positioning in the cross cradle hold, pillow support. Reviewed colostrum, supply and demand and milk coming to volume. Enc mom to feed infant STS 8-12 x in 24 hours for as long as the infant wants offering both breasts with each feeding. Enc mom to hand express before and after latch. Enc mom to massage/compress breast with feeding. Showed mom and dad teacup hold to assist with latching.   BF Resources Handout and LC Brochure given, mom informed of IP/OP Services, BF Support Groups and LC phone #. [facility] and is aware to call and make appt. Mom has a pump at home that her SIL gave her, she is planning to get new tubing, she is not sure of brand.   Mom asking good BF questions, all were answered. Enc mom to call out for feeding assistance as needed.    Maternal Data Formula Feeding for Exclusion: No Has patient been taught Hand Expression?: Yes Does the patient have breastfeeding experience prior to this delivery?: No (did not BF 31 yo)  Feeding Feeding Type: Breast Fed Length of feed: 15 min (still feeding when LC left room)  LATCH Score/Interventions Latch: Repeated attempts needed  to sustain latch, nipple held in mouth throughout feeding, stimulation needed to elicit sucking reflex. Intervention(s): Adjust position;Assist with latch;Breast massage;Breast compression  Audible Swallowing: A few with stimulation Intervention(s): Skin to skin;Hand expression;Alternate breast massage  Type of Nipple: Everted at rest and after stimulation  Comfort (Breast/Nipple): Soft / non-tender     Hold (Positioning): Assistance needed to correctly position infant at breast and maintain latch. Intervention(s): Breastfeeding basics reviewed;Support Pillows;Position options;Skin to skin  LATCH Score: 7  Lactation Tools Discussed/Used WIC Program: No (Plans to apply)   Consult Status Consult Status: Follow-up Date: 02/23/17 Follow-up type: In-patient    Silas FloodSharon S Carletha Dawn 02/22/2017, 10:35 AM

## 2017-02-22 NOTE — MAU Note (Signed)
Pt presents with complaint of contractions since 5 am, denies bleeding or ROM

## 2017-02-22 NOTE — Progress Notes (Signed)
UR chart review completed.  

## 2017-02-23 LAB — CBC
HEMATOCRIT: 34.2 % — AB (ref 36.0–46.0)
HEMOGLOBIN: 11.9 g/dL — AB (ref 12.0–15.0)
MCH: 32.6 pg (ref 26.0–34.0)
MCHC: 34.8 g/dL (ref 30.0–36.0)
MCV: 93.7 fL (ref 78.0–100.0)
Platelets: 150 10*3/uL (ref 150–400)
RBC: 3.65 MIL/uL — AB (ref 3.87–5.11)
RDW: 13.1 % (ref 11.5–15.5)
WBC: 12.6 10*3/uL — ABNORMAL HIGH (ref 4.0–10.5)

## 2017-02-23 MED ORDER — SENNOSIDES-DOCUSATE SODIUM 8.6-50 MG PO TABS: 2.0000 | ORAL_TABLET | Freq: Every day | ORAL | 1 refills | Status: AC | PRN

## 2017-02-23 MED ORDER — IBUPROFEN 600 MG PO TABS: 600.0000 mg | ORAL_TABLET | Freq: Four times a day (QID) | ORAL | 0 refills | Status: DC

## 2017-02-23 MED ORDER — IBUPROFEN 800 MG PO TABS: 800.0000 mg | ORAL_TABLET | Freq: Three times a day (TID) | ORAL | 1 refills | Status: AC | PRN

## 2017-02-23 MED ORDER — ACETAMINOPHEN 325 MG PO TABS: 650.0000 mg | ORAL_TABLET | ORAL | 0 refills | Status: AC | PRN

## 2017-02-23 MED ORDER — ACETAMINOPHEN ER 650 MG PO TBCR: 650.0000 mg | EXTENDED_RELEASE_TABLET | Freq: Three times a day (TID) | ORAL | 0 refills | Status: AC | PRN

## 2017-02-23 MED ORDER — SENNOSIDES-DOCUSATE SODIUM 8.6-50 MG PO TABS: 1.0000 | ORAL_TABLET | ORAL | 0 refills | Status: DC

## 2017-02-23 NOTE — Discharge Summary (Signed)
OB Discharge Summary     Patient Name: Nicole KelpValerie Maurin DOB: 02/01/1986 MRN: [id number]  Date of admission: 02/22/2017 Delivering MD: Jen MowMUMAW, [facility]   Date of discharge: 02/23/2017  Admitting diagnosis: LABOR Intrauterine pregnancy: 5629w5d     Secondary diagnosis:  Active Problems:   Normal labor   NSVD (normal spontaneous vaginal delivery)  Additional problems:  Patient Active Problem List   Diagnosis Date Noted  . Normal labor 02/22/2017  . NSVD (normal spontaneous vaginal delivery) 02/22/2017  . Pyelectasis of fetus on prenatal ultrasound 11/08/2016  . Abnormal genetic test in pregnancy 09/05/2016  . Pap smear of cervix shows high risk HPV present 08/23/2016  . Rh negative state in antepartum period 08/12/2016  . Supervision of high risk pregnancy, antepartum 08/10/2016  . History of preterm delivery, currently pregnant 08/10/2016  . Obesity (BMI 30-39.9) 09/22/2013  . Depression with anxiety 09/22/2013  . GERD (gastroesophageal reflux disease)        Discharge diagnosis: Term Pregnancy Delivered                                                                                                Post partum procedures:None  Augmentation: None  Complications: None  Hospital course:  Onset of Labor With Vaginal Delivery     31 y.o. yo [id number] at 2629w5d was admitted in Active Labor on 02/22/2017. Patient had an uncomplicated labor course as follows:  Membrane Rupture Time/Date:   ,    Intrapartum Procedures: Episiotomy: None [1]                                         Lacerations:  None [1]  Patient had a delivery of a Viable infant. 02/22/2017  Information for the patient's newborn:  Zada GirtMoran, Boy Betul [[id number]][[id number]]  Delivery Method: Vaginal, Spontaneous Delivery (Filed from Delivery Summary)    Pateint had an uncomplicated postpartum course.  She is ambulating, tolerating a regular diet, passing flatus, and urinating well. Patient is discharged home in stable condition on  02/23/17. See progress note from discharge day 02/23/2017.   Physical exam  Vitals:   02/22/17 1230 02/22/17 1700 02/23/17 0149 02/23/17 0650  BP: 117/68 (!) 116/57 126/65 123/62  Pulse: 67 72 (!) 56 62  Resp: 18 16 16 16   Temp: 98.8 F (37.1 C) 98.6 F (37 C) 98.5 F (36.9 C) 98.5 F (36.9 C)  TempSrc: Oral Oral Oral Oral  Weight:      Height:       For Physical Exam see Progress Note from discharge day 02/23/2017.   Labs: Lab Results  Component Value Date   WBC 12.6 (H) 02/23/2017   HGB 11.9 (L) 02/23/2017   HCT 34.2 (L) 02/23/2017   MCV 93.7 02/23/2017   PLT 150 02/23/2017   CMP Latest Ref Rng & Units 09/22/2013  Glucose 70 - 99 mg/dL 90  BUN 6 - 23 mg/dL 12  Creatinine 1.300.50 - 8.651.10 mg/dL 7.840.66  Sodium 696137 - 295147 mEq/L 138  Potassium  3.7 - 5.3 mEq/L 3.9  Chloride 96 - 112 mEq/L 101  CO2 19 - 32 mEq/L 20  Calcium 8.4 - 10.5 mg/dL 9.1  Total Protein 6.0 - 8.3 g/dL 7.0  Total Bilirubin 0.3 - 1.2 mg/dL 0.7  Alkaline Phos 39 - 117 U/L 67  AST 0 - 37 U/L 22  ALT 0 - 35 U/L 39(H)    Discharge instruction: per After Visit Summary and "Baby and Me Booklet".  After visit meds:  Allergies as of 02/23/2017   No Known Allergies     Medication List    TAKE these medications   acetaminophen 325 MG tablet Commonly known as:  TYLENOL Take 2 tablets (650 mg total) by mouth every 4 (four) hours as needed (for pain scale < 4). What changed:  when to take this  reasons to take this   ibuprofen 600 MG tablet Commonly known as:  ADVIL,MOTRIN Take 1 tablet (600 mg total) by mouth every 6 (six) hours.   multivitamin-prenatal 27-0.8 MG Tabs tablet Take 1 tablet by mouth daily at 12 noon.   senna-docusate 8.6-50 MG tablet Commonly known as:  Senokot-S Take 1 tablet by mouth daily. Start taking on:  02/24/2017       Diet: routine diet  Activity: Advance as tolerated. Pelvic rest for 6 weeks.   Outpatient follow up:4-6 weeks Follow up Appt:Future Appointments Date  Time Provider Department Center  03/29/2017 1:20 PM Aviva Signs, CNM [facility]   Follow up Visit: Follow up in 4-6 weeks with [facility].  Postpartum contraception: Vasectomy  Newborn Data: Live born female  Birth Weight: 7 lb 0.5 oz (3189 g) APGAR: 8, 9  Baby Feeding: Breast Disposition:home with mother   02/23/2017 [place] J Shirley, DO  I confirm that I have verified the information documented in the resident's note and that I have also personally reperformed the physical exam and all medical decision making activities.  Luna Kitchens CNM

## 2017-02-23 NOTE — Progress Notes (Signed)
Post Partum Day 1 Subjective: no complaints, up ad lib and breastfeeding well.   Objective: Blood pressure 126/65, pulse (!) 56, temperature 98.5 F (36.9 C), temperature source Oral, resp. rate 16, height 5\' 7"  (1.702 m), weight 210 lb (95.3 kg), last menstrual period 05/27/2016, unknown if currently breastfeeding.  Physical Exam:  General: alert and cooperative Lochia: appropriate Uterine Fundus: unable to assess while patient is breastfeeding Incision: N/A DVT Evaluation: No evidence of DVT seen on physical exam. Negative Homan's sign.   Recent Labs  02/22/17 0810  HGB 13.4  HCT 37.7    Assessment/Plan: Plan for discharge tomorrow, Breastfeeding and Contraception vasectomy   LOS: 1 day   Vonzella NippleJulie Arlyne Brandes, PA-C 02/23/2017, 6:30 AM

## 2017-02-23 NOTE — Lactation Note (Signed)
This note was copied from a baby's chart. Lactation Consultation Note  Patient Name: Boy Claud KelpValerie Penagos NFAOZ'HToday's Date: 02/23/2017  Mom is feeling good about feedings.  She at times feels a pinch but takes baby off and relatches.  Reviewed basics and discharge teaching including engorgement treatment.  Lactation outpatient services and support reviewed and encouraged prn.   Maternal Data    Feeding Feeding Type: Breast Fed Length of feed: 30 min  LATCH Score/Interventions                      Lactation Tools Discussed/Used     Consult Status      Huston FoleyMOULDEN, Joniqua Sidle S 02/23/2017, 1:49 PM

## 2017-02-23 NOTE — Lactation Note (Signed)
This note was copied from a baby's chart. Lactation Consultation Note Mom had called out during the night, LC in another consult X2. LC went to RN to go see mom, RN stated mom is sleeping, will call for next feeding. RN later checked on mom, sleeping. During medication pass, checked on mom. Mom BF. States going well, just has occasionaly pinch. Mom holding baby in football position, good support w/positioning and comfort to mom and baby.   Assessed baby's mouth to see what could cause the pinching w/good latch. No high palate. Mid upper gum has a sharp feeling in center. Sharp ridge. ? Baby had wide flange, cheeks to breast. Mom stated she can't tell if baby has deep enough latch. Heard audible swallow.  Mom has pendulum breast. Has soft breast. Mom has squirting colostrum when hand expressed. Mom happy. Nipples skin intact.  Baby satisfied after BF. Has started cluster feeding. Encouraged to occasionally massage breast during feeding.  Stressed importance of I&O, and STS.  Encouraged to stimulate nipple for deeper latch.   Patient Name: Boy Claud KelpValerie Adams WUJWJ'XToday's Date: 02/23/2017 Reason for consult: Follow-up assessment   Maternal Data    Feeding Feeding Type: Breast Fed Length of feed: 20 min  LATCH Score/Interventions Latch: Grasps breast easily, tongue down, lips flanged, rhythmical sucking. Intervention(s): Breast compression;Breast massage  Audible Swallowing: Spontaneous and intermittent Intervention(s): Skin to skin;Hand expression;Alternate breast massage  Type of Nipple: Everted at rest and after stimulation  Comfort (Breast/Nipple): Filling, red/small blisters or bruises, mild/mod discomfort  Problem noted: Mild/Moderate discomfort Interventions (Mild/moderate discomfort): Hand massage;Hand expression  Hold (Positioning): No assistance needed to correctly position infant at breast. Intervention(s): Breastfeeding basics reviewed;Support Pillows;Position options;Skin to  skin  LATCH Score: 9  Lactation Tools Discussed/Used     Consult Status Consult Status: Follow-up Date: 02/23/17 (in pm) Follow-up type: In-patient    Charyl DancerCARVER, Huie Ghuman G 02/23/2017, 6:55 AM

## 2017-02-23 NOTE — Progress Notes (Signed)
MOB was referred for history of depression/anxiety. * Referral screened out by Clinical Social Worker because none of the following criteria appear to apply: ~ History of anxiety/depression during this pregnancy, or of post-partum depression. ~ Diagnosis of anxiety and/or depression within last 3 years OR * MOB's symptoms currently being treated with medication and/or therapy.  CSW received consult for hx of marijuana use.  Referral was screened out due to the following: ~MOB had no documented substance use after initial prenatal visit/+UPT. ~MOB had no positive drug screens after initial prenatal visit/+UPT. ~Baby's UDS is was not collected Please consult CSW if current concerns arise or by MOB's request.  CSW will monitor CDS results and make report to Child Protective Services if warranted.  Please contact the Clinical Social Worker if needs arise, or if MOB requests.  Blaine HamperAngel Boyd-Gilyard, MSW, LCSW Clinical Social Work [phone]

## 2017-02-28 ENCOUNTER — Encounter: Payer: Self-pay | Admitting: Obstetrics and Gynecology

## 2017-03-29 ENCOUNTER — Ambulatory Visit: Payer: Self-pay | Admitting: Advanced Practice Midwife

## 2017-04-03 ENCOUNTER — Encounter: Payer: Self-pay | Admitting: Obstetrics & Gynecology

## 2017-04-25 ENCOUNTER — Ambulatory Visit (INDEPENDENT_AMBULATORY_CARE_PROVIDER_SITE_OTHER): Payer: Medicaid Other | Admitting: Advanced Practice Midwife

## 2017-04-25 ENCOUNTER — Encounter: Payer: Self-pay | Admitting: Advanced Practice Midwife

## 2017-04-25 ENCOUNTER — Other Ambulatory Visit (HOSPITAL_COMMUNITY)
Admission: RE | Admit: 2017-04-25 | Discharge: 2017-04-25 | Disposition: A | Discharge status: Home / Self Care | Payer: Medicaid Other | Source: Ambulatory Visit | Attending: Advanced Practice Midwife | Admitting: Advanced Practice Midwife

## 2017-04-25 DIAGNOSIS — Z1389 Encounter for screening for other disorder: Secondary | ICD-10-CM | POA: Diagnosis not present

## 2017-04-25 DIAGNOSIS — R87611 Atypical squamous cells cannot exclude high grade squamous intraepithelial lesion on cytologic smear of cervix (ASC-H): Secondary | ICD-10-CM | POA: Insufficient documentation

## 2017-04-25 DIAGNOSIS — Z30011 Encounter for initial prescription of contraceptive pills: Secondary | ICD-10-CM | POA: Diagnosis not present

## 2017-04-25 DIAGNOSIS — Z124 Encounter for screening for malignant neoplasm of cervix: Secondary | ICD-10-CM | POA: Insufficient documentation

## 2017-04-25 DIAGNOSIS — Z1151 Encounter for screening for human papillomavirus (HPV): Secondary | ICD-10-CM | POA: Diagnosis not present

## 2017-04-25 LAB — POCT PREGNANCY, URINE: PREG TEST UR: NEGATIVE

## 2017-04-25 MED ORDER — NORETHINDRONE 0.35 MG PO TABS: 1.0000 | ORAL_TABLET | Freq: Every day | ORAL | 12 refills | Status: AC

## 2017-04-25 NOTE — Patient Instructions (Addendum)
Preventing Cervical Cancer Cervical cancer is cancer that grows on the cervix. The cervix is at the bottom of the uterus. It connects the uterus to the vagina. The uterus is where a baby develops during pregnancy. Cancer occurs when cells become abnormal and start to grow out of control. Cervical cancer grows slowly and may not cause any symptoms at first. Over time, the cancer can grow deep into the cervix tissue and spread to other areas. If it is found early, cervical cancer can be treated effectively. You can also take steps to prevent this type of cancer. Most cases of cervical cancer are caused by an STI (sexually transmitted infection) called human papillomavirus (HPV). One way to reduce your risk of cervical cancer is to avoid infection with the HPV virus. You can do this by practicing safe sex and by getting the HPV vaccine. Getting regular Pap tests is also important because this can help identify changes in cells that could lead to cancer. Your chances of getting this disease can also be reduced by making certain lifestyle changes. How can I protect myself from cervical cancer? Preventing HPV infection  Ask your health care provider about getting the HPV vaccine. If you are 23 years old or younger, you may need to get this vaccine, which is given in three doses over 6 months. This vaccine protects against the types of HPV that could cause cancer.  Limit the number of people you have sex with. Also avoid having sex with people who have had many sex partners.  Use a latex condom during sex. Getting Pap tests  Get Pap tests regularly, starting at age 11. Talk with your health care provider about how often you need these tests. ? Most women who are 18?31 years of age should have a Pap test every 3 years. ? Most women who are 72?31 years of age should have a Pap test in combination with an HPV test every 5 years. ? Women with a higher risk of cervical cancer, such as those with a weakened  immune system or those who have been exposed to the drug diethylstilbestrol (DES), may need more frequent testing. Making other lifestyle changes  Do not use any products that contain nicotine or tobacco, such as cigarettes and e-cigarettes. If you need help quitting, ask your health care provider.  Eat at least 5 servings of fruits and vegetables every day.  Lose weight if you are overweight. Why are these changes important?  These changes and screening tests are designed to address the factors that are known to increase the risk of cervical cancer. Taking these steps is the best way to reduce your risk.  Having regular Pap tests will help identify changes in cells that could lead to cancer. Steps can then be taken to prevent cancer from developing.  These changes will also help find cervical cancer early. This type of cancer can be treated effectively if it is found early. It can be more dangerous and difficult to treat if cancer has grown deep into your cervix or has spread.  In addition to making you less likely to get cervical cancer, these changes will also provide other health benefits, such as the following: ? Practicing safe sex is important for preventing STIs and unplanned pregnancies. ? Avoiding tobacco can reduce your risk for other cancers and health issues. ? Eating a healthy diet and maintaining a healthy weight are good for your overall health. What can happen if changes are not made? In the  early stages, cervical cancer might not have any symptoms. It can take many years for the cancer to grow and get deep into the cervix tissue. This may be happening without you knowing about it. If you develop any symptoms, such as pelvic pain or unusual discharge or bleeding from your vagina, you should see your health care provider right away. If cervical cancer is not found early, you might need treatments such as radiation, chemotherapy, or surgery. In some cases, surgery may mean that  you will not be able to get pregnant or carry a pregnancy to term. Where to find support: Talk with your health care provider, school nurse, or local health department for guidance about screening and vaccination. Some children and teens may be able to get the HPV vaccine free of charge through the U.S. government's Vaccines for Children [facility](VFC) program. Other places that provide vaccinations include:  Public health clinics. Check with your local health department.  Federally [facility], where you would pay only what you can afford. To find one near you, check this website: http://weiss.info/www.fqhc.org/find-an-fqhc/  Rural Health Clinics. These are part of a program for Medicare and Medicaid patients who live in rural areas.  The National Breast and Cervical Cancer Early Detection Program also provides breast and cervical cancer screenings and diagnostic services to low-income, uninsured, and underinsured women. Cervical cancer can be passed down through families. Talk with your health care provider or genetic counselor to learn more about genetic testing for cancer. Where to find more information: Learn more about cervical cancer from:  [employer]: HardChecks.bewww.acog.org/Patients/FAQs/Cervical-Cancer  American Cancer Society: www.cancer.org/cancer/cervicalcancer/  U.S. Centers for Disease Control and Prevention: MedicationWarning.tnwww.cdc.gov/cancer/cervical/  Summary  Talk with your health care provider about getting the HPV vaccine.  Be sure to get regular Pap tests as recommended by your health care provider.  See your health care provider right away if you have any pelvic pain or unusual discharge or bleeding from your vagina. This information is not intended to replace advice given to you by your health care provider. Make sure you discuss any questions you have with your health care provider. Document Released: 08/22/2015 Document Revised: 04/04/2016 Document Reviewed: 04/04/2016 Elsevier  Interactive Patient Education  2018 [employer]      [facility]       Phone: [phone]   Ask about Vasectomy grant.   [facility]      Phone: [phone]

## 2017-04-25 NOTE — Progress Notes (Signed)
Subjective:     Nicole KelpValerie Lyter is a 31 y.o. female who presents for a postpartum visit. She is 8 weeks postpartum following a spontaneous vaginal delivery. I have fully reviewed the prenatal and intrapartum course. The delivery was at 38 gestational weeks. Outcome: spontaneous vaginal delivery. Anesthesia: none. Postpartum course has been Uncomplicated. Baby's course has been Uncomplicated. Baby is feeding by both breast and bottle - Enfamil AR. Bleeding no bleeding. Bowel function is normal. Bladder function is normal. Patient is sexually active. Contraception method is condoms. Husband plans vasectomy. Postpartum depression screening: negative.  The following portions of the patient's history were reviewed and updated as appropriate: allergies, current medications, past family history, past medical history, past social history, past surgical history and problem list.  Review of Systems Pertinent items are noted in HPI.  Having problems w/ poor wt gain in baby, improving. Nursing on demand.   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 Nml Colpo 09/2016. Repeat Pap recommended PP.   Objective:    BP 129/71   Pulse 66   Ht 5\' 7"  (1.702 m)   Wt 203 lb 6.4 oz (92.3 kg)   Breastfeeding? Yes   BMI 31.86 kg/m   General:  alert, cooperative, appears stated age and moderate distress   Breasts:  inspection negative, no nipple discharge or bleeding, no masses or nodularity palpable  Lungs: clear to auscultation bilaterally  Heart:  regular rate and rhythm, S1, S2 normal, no murmur, click, rub or gallop  Abdomen: soft, non-tender; bowel sounds normal; no masses,  no organomegaly   Vulva:  normal  Vagina: normal vagina, no discharge, exudate, lesion, or erythema  Cervix:  multiparous appearance, no cervical motion tenderness and scant bleeding following pap  Corpus: normal size, contour, position, consistency, mobility, non-tender  Adnexa:  no mass, fullness, tenderness  Rectal Exam: Not performed.        Assessment:     Nml postpartum exam. Pap smear done at today's visit.   Plan:    1. Contraception: oral progesterone-only contraceptive and vasectomy. Vasectomy info given.  2. Lactation PRN 3. Follow up in: 1 year or as needed.

## 2017-04-27 LAB — CYTOLOGY - PAP: HPV: DETECTED — AB

## 2017-05-05 ENCOUNTER — Encounter: Payer: Self-pay | Admitting: Medical

## 2017-05-25 ENCOUNTER — Encounter: Payer: Self-pay | Admitting: *Deleted

## 2017-05-25 ENCOUNTER — Telehealth: Payer: Self-pay | Admitting: *Deleted

## 2017-05-25 ENCOUNTER — Ambulatory Visit: Payer: Self-pay | Admitting: Medical

## 2017-05-25 NOTE — Telephone Encounter (Signed)
Nicole Haley missed a scheduled appointment for a colposcopy. I called and left a message you missed a scheduled appointment, please call back and reschedule. Will also send letter.

## 2017-10-25 ENCOUNTER — Ambulatory Visit (HOSPITAL_COMMUNITY)
Admission: EM | Admit: 2017-10-25 | Discharge: 2017-10-25 | Disposition: A | Discharge status: Home / Self Care | Payer: Medicaid Other | Attending: Family Medicine | Admitting: Family Medicine

## 2017-10-25 ENCOUNTER — Encounter (HOSPITAL_COMMUNITY): Payer: Self-pay | Admitting: Family Medicine

## 2017-10-25 DIAGNOSIS — H66003 Acute suppurative otitis media without spontaneous rupture of ear drum, bilateral: Secondary | ICD-10-CM

## 2017-10-25 DIAGNOSIS — J069 Acute upper respiratory infection, unspecified: Secondary | ICD-10-CM | POA: Diagnosis not present

## 2017-10-25 MED ORDER — AMOXICILLIN 875 MG PO TABS: 875.0000 mg | ORAL_TABLET | Freq: Two times a day (BID) | ORAL | 0 refills | Status: AC

## 2017-10-25 NOTE — ED Triage Notes (Signed)
Pt here for bilateral ear pain with congestion and runny nose. sts cold chills. Started yesterday.

## 2017-10-25 NOTE — ED Provider Notes (Signed)
[facility]   [id number] 10/25/17 Arrival Time: 1408  ASSESSMENT & PLAN:  1. Viral upper respiratory tract infection   2. Acute suppurative otitis media of both ears without spontaneous rupture of tympanic membranes, recurrence not specified     Meds ordered this encounter  Medications  . amoxicillin (AMOXIL) 875 MG tablet    Sig: Take 1 tablet (875 mg total) by mouth 2 (two) times daily for 10 days.    Dispense:  20 tablet    Refill:  0   Discussed typical duration of symptoms. OTC symptom care as needed. Ensure adequate fluid intake and rest. May f/u with PCP or here as needed.  Reviewed expectations re: course of current medical issues. Questions answered. Outlined signs and symptoms indicating need for more acute intervention. Patient verbalized understanding. After Visit Summary given.   SUBJECTIVE: History from: patient.  Nicole Haley is a 32 y.o. female who presents with complaint of nasal congestion, post-nasal drainage, and a persistent dry cough. Now with bilateral otalgia without drainage; decreased hearing. Onset abrupt, approximately a few days ago. Overall fatigued with body aches. SOB: none. Wheezing: none. Fever: no. Overall normal PO intake without n/v. Sick contacts: no. OTC treatment: NyQuil with mild help.   Social History   Tobacco Use  Smoking Status Former Smoker  . Packs/day: 0.50  . Years: 15.00  . Pack years: 7.50  . Types: Cigarettes  Smokeless Tobacco Never Used    ROS: As per HPI.   OBJECTIVE:  Vitals:   10/25/17 1437  BP: 124/75  Pulse: 86  Resp: 18  Temp: 98.7 F (37.1 C)  TempSrc: Oral  SpO2: 100%     General appearance: alert; appears fatigued HEENT: nasal congestion; clear runny nose; throat irritation secondary to post-nasal drainage; bilateral TMs with erythema and bulging Neck: supple without LAD Lungs: unlabored respirations, symmetrical air entry; cough: mild; no respiratory distress Skin: warm and  dry Psychological: alert and cooperative; normal mood and affect  Imaging: No results found.  No Known Allergies  Past Medical History:  Diagnosis Date  . Anemia    low iron? never been able to give blood, but not diagnosed  . Gallstones   . GERD (gastroesophageal reflux disease)   . Headache(784.0)   . History of cholecystectomy 08/10/2016  . Sciatica    Family History  Problem Relation Age of Onset  . Diabetes Mother   . Hypertension Mother   . Hypertension Father    Social History   Socioeconomic History  . Marital status: Divorced    Spouse name: Not on file  . Number of children: Not on file  . Years of education: Not on file  . Highest education level: Not on file  Social Needs  . Financial resource strain: Not on file  . Food insecurity - worry: Not on file  . Food insecurity - inability: Not on file  . Transportation needs - medical: Not on file  . Transportation needs - non-medical: Not on file  Occupational History  . Not on file  Tobacco Use  . Smoking status: Former Smoker    Packs/day: 0.50    Years: 15.00    Pack years: 7.50    Types: Cigarettes  . Smokeless tobacco: Never Used  Substance and Sexual Activity  . Alcohol use: Yes    Comment: none with pregnancy  . Drug use: Yes    Frequency: 7.0 times per week    Types: Marijuana    Comment: none with pregnancy  .  Sexual activity: Yes    Birth control/protection: Condom  Other Topics Concern  . Not on file  Social History Narrative  . Not on file           Mardella LaymanHagler, Marquisa Salih, MD 10/25/17 1505

## 2017-12-25 ENCOUNTER — Encounter: Payer: Self-pay | Admitting: *Deleted

## 2018-06-19 IMAGING — US US MFM OB DETAIL+14 WK
1 series · 13 of 28 positions shown · non-contrast
Comparison: none

[Series 1: us mfm ob detail+14 wk · 113 acquisitions, 13 frames shown]
[im 5/113]
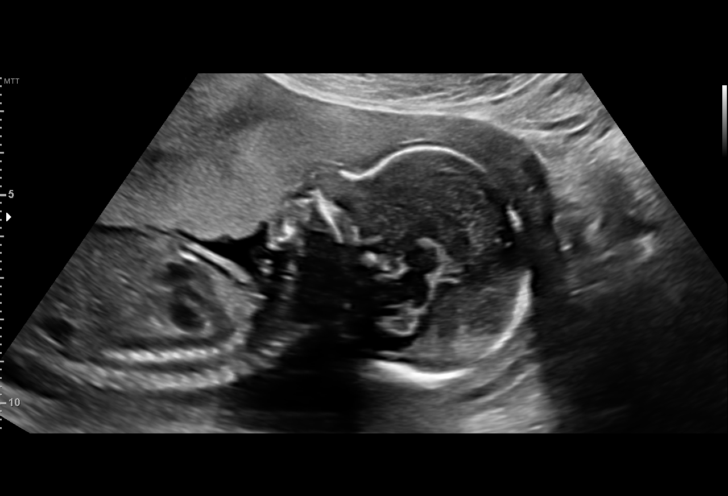
[im 13/113]
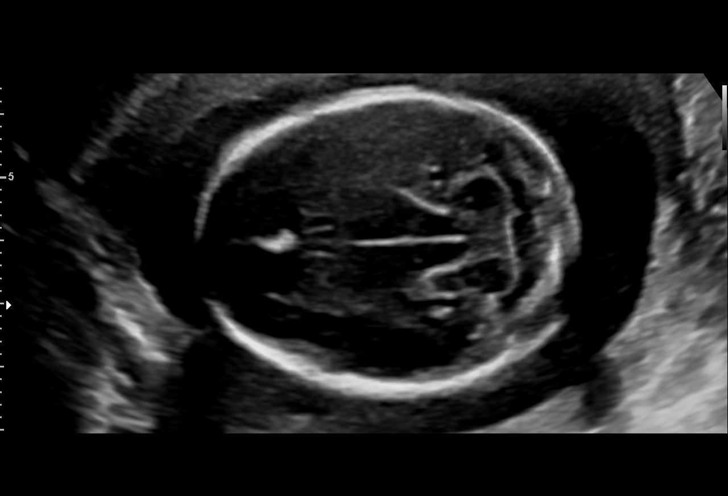
[im 21/113]
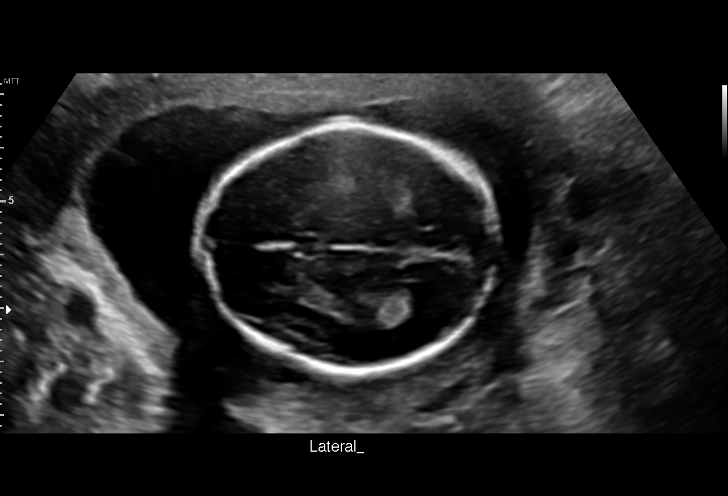
[im 30/113]
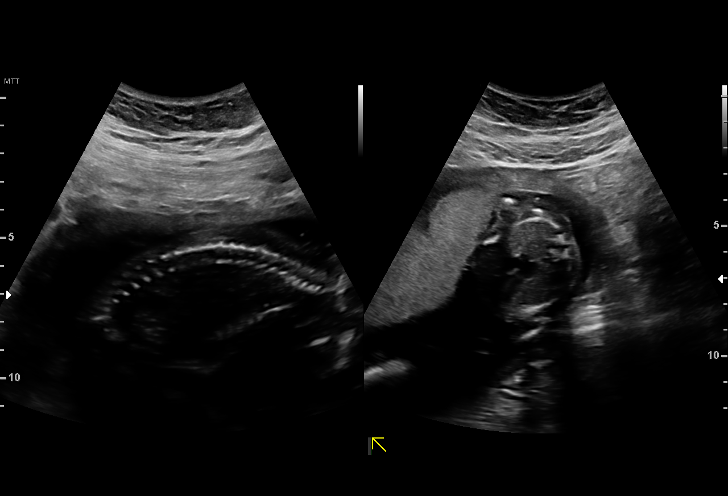
[im 38/113]
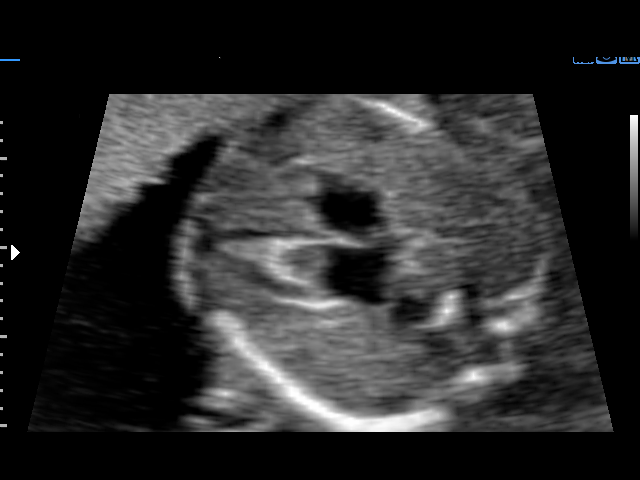
[im 46/113]
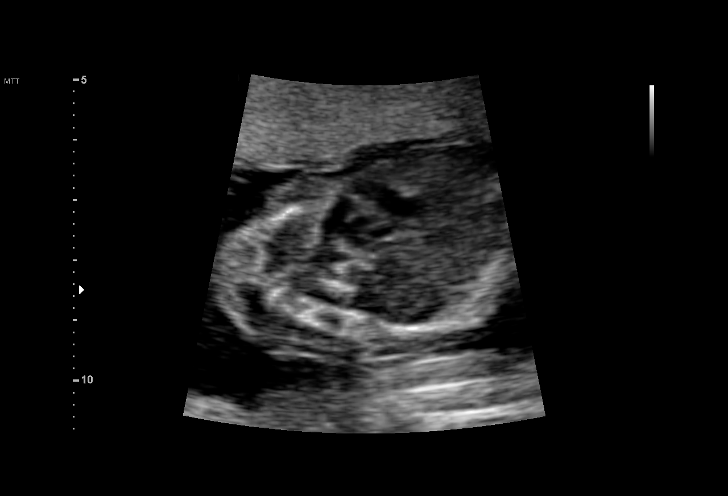
[im 59/113]
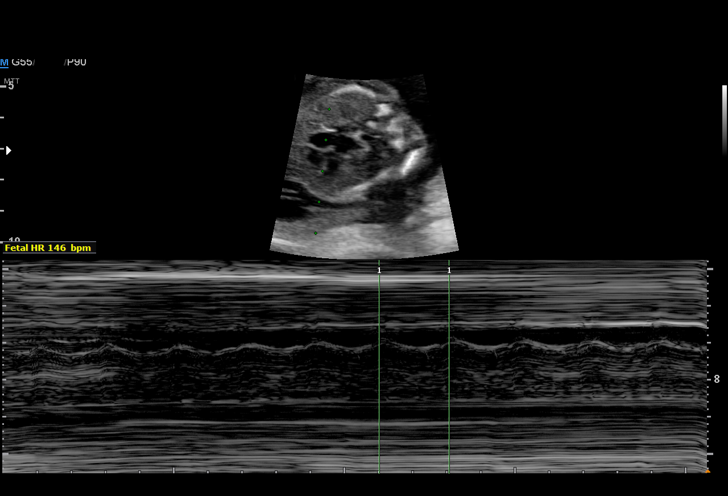
[im 67/113]
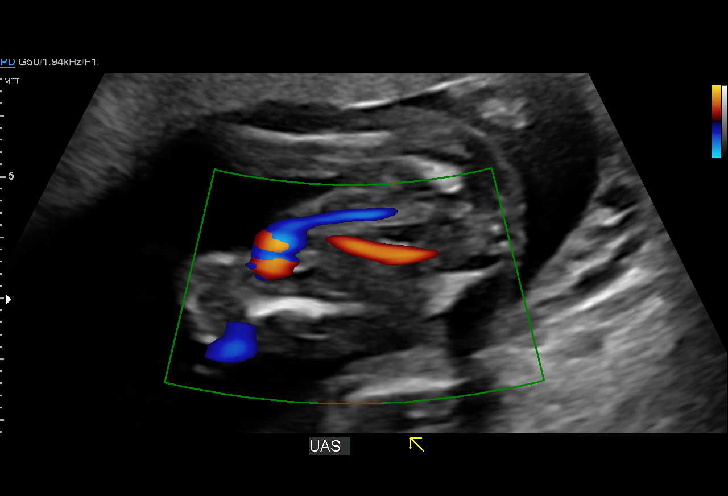
[im 75/113]
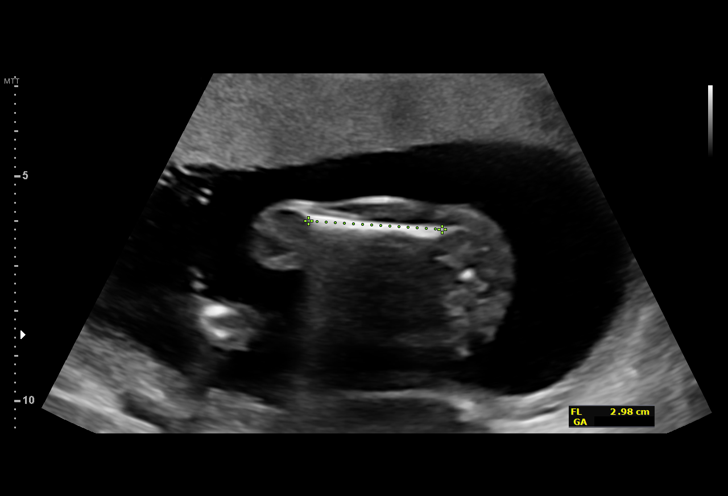
[im 83/113]
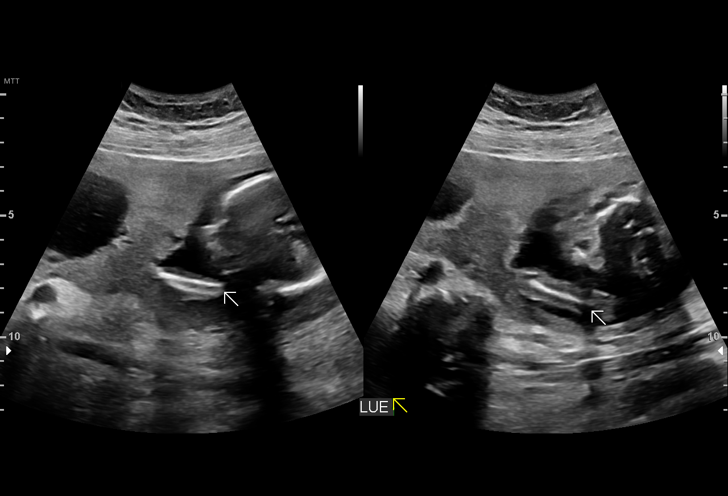
[im 92/113]
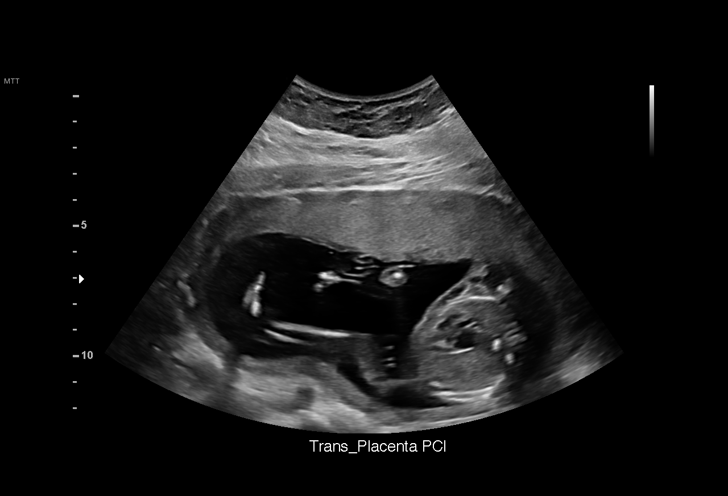
[im 100/113]
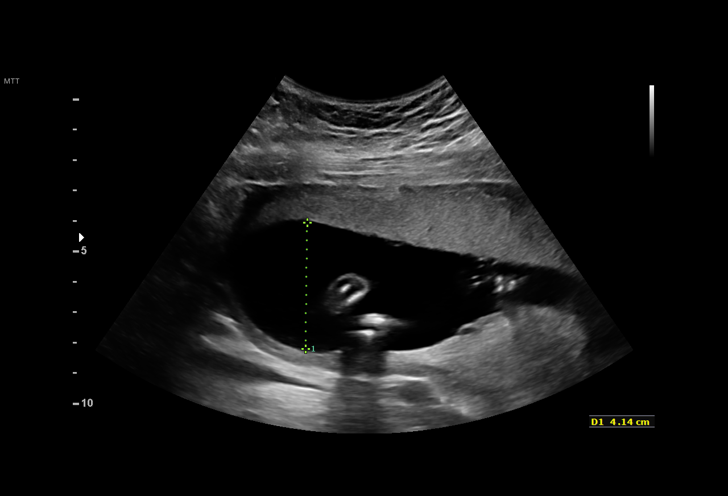
[im 108/113]
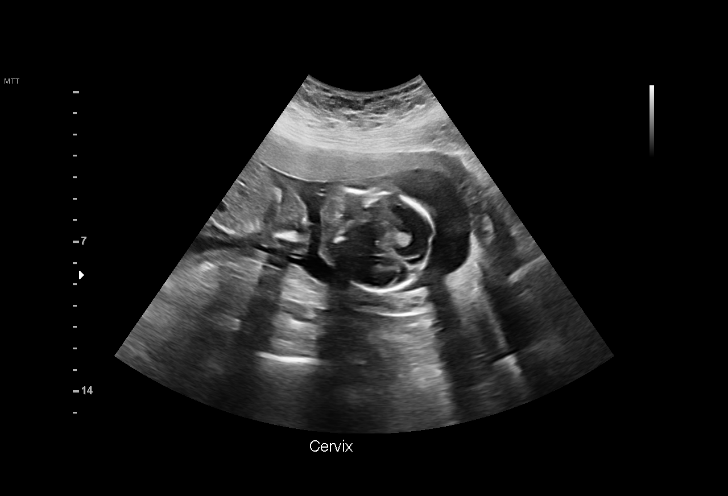

[13 of 28 positions shown; findings below may reference images not displayed]

OB/Gyn Clinic

Indications

18 weeks gestation of pregnancy
Obesity complicating pregnancy
Poor obstetric history (prior pre-term labor)
Poor obstetric history: Previous preterm
delivery, antepartum (35 wks? No PNC, 6
[DATE] lbs; 20 week delivery, not living at
delivery)
Encounter for antenatal screening for
malformations
Abnormal finding on antenatal screening
(Heinrich Constanza)
Rh negative state in antepartum
OB History

Blood Type:            Height:  5'6"   Weight (lb):  201      BMI:
Gravidity:    8         Term:   0        Prem:   2        SAB:   2
TOP:          3       Ectopic:  0        Living: 1
Fetal Evaluation

Num Of Fetuses:     1
Fetal Heart         146
Rate(bpm):
Cardiac Activity:   Observed
Presentation:       Cephalic
Placenta:           Anterior, above cervical os
P. Cord Insertion:  Visualized, central

Amniotic Fluid
AFI FV:      Subjectively within normal limits

Largest Pocket(cm)
4.1
Biometry

BPD:        46  mm     G. Age:  19w 6d         91  %    CI:        78.26   %   70 - 86
FL/HC:      17.8   %   16.1 -
HC:      164.5  mm     G. Age:  19w 1d         65  %    HC/AC:      1.16       1.09 -
AC:      142.2  mm     G. Age:  19w 4d         75  %    FL/BPD:     63.5   %
FL:       29.2  mm     G. Age:  19w 0d         54  %    FL/AC:      20.5   %   20 - 24
HUM:      27.7  mm     G. Age:  18w 6d         56  %
CER:        20  mm     G. Age:  19w 0d         61  %
NFT:       3.6  mm

CM:        4.1  mm

Est. FW:     285  gm    0 lb 10 oz      55  %
Gestational Age

LMP:           18w 5d       Date:   05/27/16                 EDD:   03/03/17
U/S Today:     19w 3d                                        EDD:   02/26/17
Best:          18w 5d    Det. By:   LMP  (05/27/16)          EDD:   03/03/17
Anatomy

Cranium:               Appears normal         Aortic Arch:            Appears normal
Cavum:                 Appears normal         Ductal Arch:            Appears normal
Ventricles:            Appears normal         Diaphragm:              Appears normal
Choroid Plexus:        Appears normal         Stomach:                Appears normal, left
sided
Cerebellum:            Appears normal         Abdomen:                Appears normal
Posterior Fossa:       Appears normal         Abdominal Wall:         Appears nml (cord
insert, abd wall)
Nuchal Fold:           Appears normal         Cord Vessels:           Appears normal (3
vessel cord)
Face:                  Appears normal         Kidneys:                Bilat pyelectasis,
(orbits and profile)
Rt 6mm, Lt 7mm
Lips:                  Appears normal         Bladder:                Appears normal
Thoracic:              Appears normal         Spine:                  Appears normal
Heart:                 Appears normal         Upper Extremities:      Appears normal
(4CH, axis, and situs
RVOT:                  Appears normal         Lower Extremities:      Appears normal
LVOT:                  Appears normal

Other:  Parents DO NOT wish to know sex of fetus.  Fetus appears to be a
male. Heels visualized. Nasal bone visualized. Technically difficult
due to maternal habitus.
Cervix Uterus Adnexa

Cervix
Length:            3.8  cm.
Normal appearance by transabdominal scan.
Uterus
No abnormality visualized.

Left Ovary
Size(cm)     1.73  x    1.86   x  1.61      Vol(ml):
Within normal limits. No adnexal mass visualized.

Right Ovary
Size(cm)     2.77  x    1.26   x  1.15      Vol(ml):
Within normal limits. No adnexal mass visualized.

Cul De Sac:   No free fluid seen.

Adnexa:       No abnormality visualized.
Impression

SIUP at 18+5 weeks
Bilateral UTD A1
All other detailed fetal anatomy was seen and appeared
normal
Markers of aneuploidy: above (S/P first trimester screen)
Normal amniotic fluid volume
Measurements consistent with LMP dating
Recommendations

Follow-up ultrasound for growth in 6 weeks (kidneys/[place]amada
WILANI)

## 2018-07-08 ENCOUNTER — Encounter: Payer: Self-pay | Admitting: *Deleted

## 2018-12-19 ENCOUNTER — Encounter: Payer: Self-pay | Admitting: *Deleted

## 2020-02-19 DIAGNOSIS — Z419 Encounter for procedure for purposes other than remedying health state, unspecified: Secondary | ICD-10-CM | POA: Diagnosis not present

## 2020-03-21 DIAGNOSIS — Z419 Encounter for procedure for purposes other than remedying health state, unspecified: Secondary | ICD-10-CM | POA: Diagnosis not present

## 2020-04-21 DIAGNOSIS — Z419 Encounter for procedure for purposes other than remedying health state, unspecified: Secondary | ICD-10-CM | POA: Diagnosis not present

## 2020-05-21 DIAGNOSIS — Z419 Encounter for procedure for purposes other than remedying health state, unspecified: Secondary | ICD-10-CM | POA: Diagnosis not present

## 2020-06-21 DIAGNOSIS — Z419 Encounter for procedure for purposes other than remedying health state, unspecified: Secondary | ICD-10-CM | POA: Diagnosis not present

## 2020-07-21 DIAGNOSIS — Z419 Encounter for procedure for purposes other than remedying health state, unspecified: Secondary | ICD-10-CM | POA: Diagnosis not present

## 2020-08-21 DIAGNOSIS — Z419 Encounter for procedure for purposes other than remedying health state, unspecified: Secondary | ICD-10-CM | POA: Diagnosis not present

## 2020-09-21 DIAGNOSIS — Z419 Encounter for procedure for purposes other than remedying health state, unspecified: Secondary | ICD-10-CM | POA: Diagnosis not present

## 2020-10-19 DIAGNOSIS — Z419 Encounter for procedure for purposes other than remedying health state, unspecified: Secondary | ICD-10-CM | POA: Diagnosis not present

## 2020-11-19 DIAGNOSIS — Z419 Encounter for procedure for purposes other than remedying health state, unspecified: Secondary | ICD-10-CM | POA: Diagnosis not present

## 2020-12-19 DIAGNOSIS — Z419 Encounter for procedure for purposes other than remedying health state, unspecified: Secondary | ICD-10-CM | POA: Diagnosis not present

## 2021-01-19 DIAGNOSIS — Z419 Encounter for procedure for purposes other than remedying health state, unspecified: Secondary | ICD-10-CM | POA: Diagnosis not present

## 2021-02-18 DIAGNOSIS — Z419 Encounter for procedure for purposes other than remedying health state, unspecified: Secondary | ICD-10-CM | POA: Diagnosis not present

## 2021-03-21 DIAGNOSIS — Z419 Encounter for procedure for purposes other than remedying health state, unspecified: Secondary | ICD-10-CM | POA: Diagnosis not present

## 2021-04-21 DIAGNOSIS — Z419 Encounter for procedure for purposes other than remedying health state, unspecified: Secondary | ICD-10-CM | POA: Diagnosis not present

## 2021-05-21 DIAGNOSIS — Z419 Encounter for procedure for purposes other than remedying health state, unspecified: Secondary | ICD-10-CM | POA: Diagnosis not present

## 2021-06-21 DIAGNOSIS — Z419 Encounter for procedure for purposes other than remedying health state, unspecified: Secondary | ICD-10-CM | POA: Diagnosis not present

## 2021-07-21 DIAGNOSIS — Z419 Encounter for procedure for purposes other than remedying health state, unspecified: Secondary | ICD-10-CM | POA: Diagnosis not present

## 2021-08-21 DIAGNOSIS — Z419 Encounter for procedure for purposes other than remedying health state, unspecified: Secondary | ICD-10-CM | POA: Diagnosis not present

## 2021-09-21 DIAGNOSIS — Z419 Encounter for procedure for purposes other than remedying health state, unspecified: Secondary | ICD-10-CM | POA: Diagnosis not present

## 2021-10-19 DIAGNOSIS — Z419 Encounter for procedure for purposes other than remedying health state, unspecified: Secondary | ICD-10-CM | POA: Diagnosis not present

## 2021-11-19 DIAGNOSIS — Z419 Encounter for procedure for purposes other than remedying health state, unspecified: Secondary | ICD-10-CM | POA: Diagnosis not present

## 2021-12-19 DIAGNOSIS — Z419 Encounter for procedure for purposes other than remedying health state, unspecified: Secondary | ICD-10-CM | POA: Diagnosis not present

## 2022-01-19 DIAGNOSIS — Z419 Encounter for procedure for purposes other than remedying health state, unspecified: Secondary | ICD-10-CM | POA: Diagnosis not present

## 2022-02-18 DIAGNOSIS — Z419 Encounter for procedure for purposes other than remedying health state, unspecified: Secondary | ICD-10-CM | POA: Diagnosis not present

## 2022-03-21 DIAGNOSIS — Z419 Encounter for procedure for purposes other than remedying health state, unspecified: Secondary | ICD-10-CM | POA: Diagnosis not present

## 2022-04-21 DIAGNOSIS — Z419 Encounter for procedure for purposes other than remedying health state, unspecified: Secondary | ICD-10-CM | POA: Diagnosis not present

## 2022-05-21 DIAGNOSIS — Z419 Encounter for procedure for purposes other than remedying health state, unspecified: Secondary | ICD-10-CM | POA: Diagnosis not present

## 2022-06-21 DIAGNOSIS — Z419 Encounter for procedure for purposes other than remedying health state, unspecified: Secondary | ICD-10-CM | POA: Diagnosis not present

## 2022-07-21 DIAGNOSIS — Z419 Encounter for procedure for purposes other than remedying health state, unspecified: Secondary | ICD-10-CM | POA: Diagnosis not present

## 2022-08-21 DIAGNOSIS — Z419 Encounter for procedure for purposes other than remedying health state, unspecified: Secondary | ICD-10-CM | POA: Diagnosis not present

## 2022-09-21 DIAGNOSIS — Z419 Encounter for procedure for purposes other than remedying health state, unspecified: Secondary | ICD-10-CM | POA: Diagnosis not present

## 2022-10-20 DIAGNOSIS — Z419 Encounter for procedure for purposes other than remedying health state, unspecified: Secondary | ICD-10-CM | POA: Diagnosis not present

## 2022-11-20 DIAGNOSIS — Z419 Encounter for procedure for purposes other than remedying health state, unspecified: Secondary | ICD-10-CM | POA: Diagnosis not present

## 2022-12-20 DIAGNOSIS — Z419 Encounter for procedure for purposes other than remedying health state, unspecified: Secondary | ICD-10-CM | POA: Diagnosis not present

## 2023-01-20 DIAGNOSIS — Z419 Encounter for procedure for purposes other than remedying health state, unspecified: Secondary | ICD-10-CM | POA: Diagnosis not present

## 2023-02-19 DIAGNOSIS — Z419 Encounter for procedure for purposes other than remedying health state, unspecified: Secondary | ICD-10-CM | POA: Diagnosis not present

## 2023-03-22 DIAGNOSIS — Z419 Encounter for procedure for purposes other than remedying health state, unspecified: Secondary | ICD-10-CM | POA: Diagnosis not present

## 2023-04-22 DIAGNOSIS — Z419 Encounter for procedure for purposes other than remedying health state, unspecified: Secondary | ICD-10-CM | POA: Diagnosis not present

## 2023-05-22 DIAGNOSIS — Z419 Encounter for procedure for purposes other than remedying health state, unspecified: Secondary | ICD-10-CM | POA: Diagnosis not present

## 2023-06-22 DIAGNOSIS — Z419 Encounter for procedure for purposes other than remedying health state, unspecified: Secondary | ICD-10-CM | POA: Diagnosis not present

## 2023-07-22 DIAGNOSIS — Z419 Encounter for procedure for purposes other than remedying health state, unspecified: Secondary | ICD-10-CM | POA: Diagnosis not present

## 2023-08-22 DIAGNOSIS — Z419 Encounter for procedure for purposes other than remedying health state, unspecified: Secondary | ICD-10-CM | POA: Diagnosis not present

## 2023-09-22 DIAGNOSIS — Z419 Encounter for procedure for purposes other than remedying health state, unspecified: Secondary | ICD-10-CM | POA: Diagnosis not present

## 2023-10-20 DIAGNOSIS — Z419 Encounter for procedure for purposes other than remedying health state, unspecified: Secondary | ICD-10-CM | POA: Diagnosis not present

## 2023-12-01 DIAGNOSIS — Z419 Encounter for procedure for purposes other than remedying health state, unspecified: Secondary | ICD-10-CM | POA: Diagnosis not present

## 2023-12-31 DIAGNOSIS — Z419 Encounter for procedure for purposes other than remedying health state, unspecified: Secondary | ICD-10-CM | POA: Diagnosis not present

## 2024-01-31 DIAGNOSIS — Z419 Encounter for procedure for purposes other than remedying health state, unspecified: Secondary | ICD-10-CM | POA: Diagnosis not present

## 2024-03-01 DIAGNOSIS — Z419 Encounter for procedure for purposes other than remedying health state, unspecified: Secondary | ICD-10-CM | POA: Diagnosis not present

## 2024-04-01 DIAGNOSIS — Z419 Encounter for procedure for purposes other than remedying health state, unspecified: Secondary | ICD-10-CM | POA: Diagnosis not present

## 2024-05-02 DIAGNOSIS — Z419 Encounter for procedure for purposes other than remedying health state, unspecified: Secondary | ICD-10-CM | POA: Diagnosis not present

## 2024-05-26 DIAGNOSIS — N6081 Other benign mammary dysplasias of right breast: Secondary | ICD-10-CM | POA: Diagnosis not present

## 2024-06-01 DIAGNOSIS — Z419 Encounter for procedure for purposes other than remedying health state, unspecified: Secondary | ICD-10-CM | POA: Diagnosis not present

## 2024-07-02 DIAGNOSIS — Z419 Encounter for procedure for purposes other than remedying health state, unspecified: Secondary | ICD-10-CM | POA: Diagnosis not present
